# Patient Record
Sex: Female | Born: 1970 | Race: Black or African American | Hispanic: No | Marital: Married | Smoking: Never smoker
Health system: Southern US, Community
[De-identification: ages and names within clinical notes are randomized; demographics above are authoritative.]

## PROBLEM LIST (undated history)

## (undated) DIAGNOSIS — R011 Cardiac murmur, unspecified: Secondary | ICD-10-CM

## (undated) DIAGNOSIS — I10 Essential (primary) hypertension: Secondary | ICD-10-CM

## (undated) HISTORY — DX: Essential (primary) hypertension: I10

## (undated) HISTORY — DX: Cardiac murmur, unspecified: R01.1

---

## 1993-09-04 HISTORY — PX: ECTOPIC PREGNANCY SURGERY: SHX613

## 1995-09-05 HISTORY — PX: CHOLECYSTECTOMY: SHX55

## 2003-03-30 ENCOUNTER — Other Ambulatory Visit: Admission: RE | Admit: 2003-03-30 | Discharge: 2003-03-30 | Payer: Self-pay | Admitting: Gynecology

## 2006-09-04 HISTORY — PX: ABDOMINAL HYSTERECTOMY: SHX81

## 2007-11-23 ENCOUNTER — Emergency Department (HOSPITAL_COMMUNITY): Admission: EM | Admit: 2007-11-23 | Discharge: 2007-11-23 | Payer: Self-pay | Admitting: Emergency Medicine

## 2007-11-29 ENCOUNTER — Emergency Department (HOSPITAL_COMMUNITY): Admission: EM | Admit: 2007-11-29 | Discharge: 2007-11-29 | Payer: Self-pay | Admitting: Family Medicine

## 2008-01-04 ENCOUNTER — Emergency Department (HOSPITAL_COMMUNITY): Admission: EM | Admit: 2008-01-04 | Discharge: 2008-01-04 | Payer: Self-pay | Admitting: Family Medicine

## 2008-05-19 ENCOUNTER — Encounter: Payer: Self-pay | Admitting: Gynecology

## 2008-05-19 ENCOUNTER — Other Ambulatory Visit: Admission: RE | Admit: 2008-05-19 | Discharge: 2008-05-19 | Payer: Self-pay | Admitting: Gynecology

## 2008-05-19 ENCOUNTER — Ambulatory Visit: Payer: Self-pay | Admitting: Gynecology

## 2008-07-27 ENCOUNTER — Ambulatory Visit: Payer: Self-pay | Admitting: Gynecology

## 2009-03-25 ENCOUNTER — Ambulatory Visit: Payer: Self-pay | Admitting: Gynecology

## 2009-04-26 ENCOUNTER — Ambulatory Visit: Payer: Self-pay | Admitting: Gynecology

## 2010-04-18 ENCOUNTER — Emergency Department (HOSPITAL_COMMUNITY): Admission: EM | Admit: 2010-04-18 | Discharge: 2010-04-18 | Payer: Self-pay | Admitting: Emergency Medicine

## 2010-11-18 LAB — CBC
Hemoglobin: 14.5 g/dL (ref 12.0–15.0)
MCH: 27 pg (ref 26.0–34.0)
MCHC: 33.2 g/dL (ref 30.0–36.0)
Platelets: 268 10*3/uL (ref 150–400)
RBC: 5.38 MIL/uL — ABNORMAL HIGH (ref 3.87–5.11)
RDW: 13.8 % (ref 11.5–15.5)
WBC: 4.6 10*3/uL (ref 4.0–10.5)

## 2010-11-18 LAB — COMPREHENSIVE METABOLIC PANEL
ALT: 20 U/L (ref 0–35)
BUN: 19 mg/dL (ref 6–23)
CO2: 30 mEq/L (ref 19–32)
Creatinine, Ser: 0.88 mg/dL (ref 0.4–1.2)
GFR calc non Af Amer: >60 mL/min (ref >60)
Potassium: 3.9 mEq/L (ref 3.5–5.1)
Sodium: 138 mEq/L (ref 135–145)
Total Bilirubin: 0.7 mg/dL (ref 0.3–1.2)
Total Protein: 7.4 g/dL (ref 6.0–8.3)

## 2010-11-18 LAB — URINALYSIS, ROUTINE W REFLEX MICROSCOPIC
Bilirubin Urine: NEGATIVE
Ketones, ur: NEGATIVE mg/dL
Urobilinogen, UA: 0.2 mg/dL (ref 0.0–1.0)

## 2010-11-18 LAB — DIFFERENTIAL
Basophils Absolute: 0 10*3/uL (ref 0.0–0.1)
Basophils Relative: 1 % (ref 0–1)
Lymphocytes Relative: 34 % (ref 12–46)
Lymphs Abs: 1.5 10*3/uL (ref 0.7–4.0)
Monocytes Relative: 9 % (ref 3–12)
Neutrophils Relative %: 55 % (ref 43–77)

## 2010-11-18 LAB — POCT CARDIAC MARKERS

## 2011-02-16 ENCOUNTER — Ambulatory Visit (INDEPENDENT_AMBULATORY_CARE_PROVIDER_SITE_OTHER): Payer: PRIVATE HEALTH INSURANCE | Admitting: Family Medicine

## 2011-02-16 ENCOUNTER — Ambulatory Visit (HOSPITAL_BASED_OUTPATIENT_CLINIC_OR_DEPARTMENT_OTHER)
Admission: RE | Admit: 2011-02-16 | Discharge: 2011-02-16 | Disposition: A | Discharge status: Home / Self Care | Payer: PRIVATE HEALTH INSURANCE | Source: Ambulatory Visit | Attending: Family Medicine | Admitting: Family Medicine

## 2011-02-16 ENCOUNTER — Encounter: Payer: Self-pay | Admitting: Family Medicine

## 2011-02-16 DIAGNOSIS — R635 Abnormal weight gain: Secondary | ICD-10-CM

## 2011-02-16 DIAGNOSIS — I10 Essential (primary) hypertension: Secondary | ICD-10-CM

## 2011-02-16 DIAGNOSIS — E049 Nontoxic goiter, unspecified: Secondary | ICD-10-CM

## 2011-02-16 DIAGNOSIS — R220 Localized swelling, mass and lump, head: Secondary | ICD-10-CM | POA: Insufficient documentation

## 2011-02-16 DIAGNOSIS — R059 Cough, unspecified: Secondary | ICD-10-CM | POA: Insufficient documentation

## 2011-02-16 DIAGNOSIS — Z0000 Encounter for general adult medical examination without abnormal findings: Secondary | ICD-10-CM

## 2011-02-16 DIAGNOSIS — R05 Cough: Secondary | ICD-10-CM | POA: Insufficient documentation

## 2011-02-16 LAB — LDL CHOLESTEROL, DIRECT: Direct LDL: 102 mg/dL

## 2011-02-16 LAB — BASIC METABOLIC PANEL
CO2: 29 mEq/L (ref 19–32)
Calcium: 9.9 mg/dL (ref 8.4–10.5)
Chloride: 104 mEq/L (ref 96–112)

## 2011-02-16 LAB — CBC WITH DIFFERENTIAL/PLATELET
Basophils Relative: 1 % (ref 0.0–3.0)
Eosinophils Absolute: 0.1 10*3/uL (ref 0.0–0.7)
Hemoglobin: 14.1 g/dL (ref 12.0–15.0)
Lymphocytes Relative: 29.4 % (ref 12.0–46.0)
MCHC: 33.1 g/dL (ref 30.0–36.0)
MCV: 82.4 fl (ref 78.0–100.0)
Neutrophils Relative %: 61.5 % (ref 43.0–77.0)
Platelets: 301 10*3/uL (ref 150.0–400.0)
RBC: 5.17 Mil/uL — ABNORMAL HIGH (ref 3.87–5.11)
RDW: 13.9 % (ref 11.5–14.6)
WBC: 6.2 10*3/uL (ref 4.5–10.5)

## 2011-02-16 LAB — HEPATIC FUNCTION PANEL
ALT: 17 U/L (ref 0–35)
Albumin: 4.5 g/dL (ref 3.5–5.2)
Alkaline Phosphatase: 54 U/L (ref 39–117)

## 2011-02-16 LAB — T4, FREE: Free T4: 0.77 ng/dL (ref 0.60–1.60)

## 2011-02-16 LAB — TSH: TSH: 0.6 u[IU]/mL (ref 0.35–5.50)

## 2011-02-16 MED ORDER — TRIAMTERENE-HCTZ 37.5-25 MG PO TABS: 1.0000 | ORAL_TABLET | Freq: Every day | ORAL | Status: DC

## 2011-02-16 NOTE — Patient Instructions (Signed)
IGoiter Goiter is an enlarged thyroid gland. The thyroid gland sits at the base of the front of the neck. The gland produces important hormones for the body that regulate mood, body temperature, pulse rate, digestion, and many other functions. Most goiters are painless and are not a cause for serious concern. Goiters and conditions that cause goiters can be treated if necessary.  CAUSES  Common causes of goiter include:  Graves disease (causes too much hormone to be produced [hyperthyroidism]).   Hashimoto's disease (causes too little hormone to be produced [hypothyroidism]).   Thyroiditis (inflammation of the gland sometimes due to virus or pregnancy).   Nodular goiter (small bumps form; sometimes called toxic nodular goiter).   Pregnancy.   Thyroid cancer (very small percentage of goiters with nodules are cancerous).   Certain medications.   Radiation exposure.   Iodine deficiency (more common in developing countries in inland populations).  Risk factors for goiter include:  Having a family history of goiter.   Being female.   Inadequate iodine in the diet.   Being older than 40 years.  SYMPTOMS Many goiters do not cause symptoms. When they do occur, symptoms may include:  Swelling in the lower part of the neck. This swelling can range from a very small bump to a large lump.   A tight feeling in the throat.   A hoarse voice.  Less commonly, a goiter may result in:  Coughing.   Wheezing.   Difficulty swallowing.   Difficulty breathing.   Bulging neck veins.   Dizziness.  When a goiter is the result of an overactive thyroid (hyperthyroidism), symptoms may include:  Rapid or irregular heart beat.   Feeling sick to your stomach (nauseous).   Vomiting.   Diarrhea.   Shaking.   Irritable feeling.   Bulging eyes.   Weight loss.   Heat sensitivity.   Anxiety.  When a goiter is the result of an underactive thyroid (hypothyroidism), symptoms may  include:  Tiredness.   Dry skin.   Constipation.   Weight gain.   Irregular menstrual cycle.   Depressed mood.   Sensitivity to cold.  DIAGNOSIS Tests used to diagnose goiter include:  A physical exam.   Blood tests, including thyroid hormone levels and antibody testing.   Ultrasonography, computerized X-ray scan (computed tomography, CT) or computerized magnetic scan (magnetic resonance imaging, MRI).   Thyroid scan (imaging along with safe radioactive injection).   Tissue sample taken (biopsy) of nodules. This is sometimes done to confirm that the nodules are not cancerous.  TREATMENT Treatment will depend on the cause of the goiter. Treatment may include:  Monitoring. In some cases, no treatment is necessary, and your doctor will monitor you at regular check ups.   Medications and supplements. Thyroid medication (thyroid hormone replacement) is available for overactive and underactive thyroids.   If inflammation is the cause, over-the-counter medication or steroid medication may be recommended.   Goiters caused by iodine deficiency can be treated with iodine supplements or changes in diet.   Radioactive iodine treatment. Radioactive iodine is injected into the blood. It travels to the thyroid gland, kills thyroid cells, and reduces the size of the gland. This is only used when the thyroid gland is overactive. Lifelong thyroid hormone medication is often necessary after this treatment.   Surgery. A procedure to remove all or part of the gland may be recommended in severe cases or when cancer is the cause. Hormones can be taken to replace the hormones normally produced  by the thyroid.  HOME CARE INSTRUCTIONS   Take medications as directed.   Follow your caregiver's recommendations for any dietary changes.   Follow up with your caregiver for further examination and testing, as directed.  PROGNOSIS Once the cause of the goiter is known, treatment to relieve symptoms or  manage underlying conditions is usually very successful. PREVENTION   If you have a family history of goiter, discuss screening with your doctor.   Make sure you are getting enough iodine in your diet.   Use of iodized table salt can help prevent iodine deficiency.  SEEK MEDICAL CARE IF YOU EXPERIENCE ANY SYMPTOMS OF GOITER SEEK IMMEDIATE MEDICAL CARE IF:  You have difficulty swallowing.   You have difficulty breathing.   You have bulging neck veins.   You experience dizziness.   You develop a rapid or irregular heart beat.  MAKE SURE YOU:   Understand these instructions.   Watch your condition.   Will get help right away if you are not doing well or get worse.  Not all test results may be available during your visit. If your test results are not back during the visit, make an appointment with your caregiver to find out the results. Do not assume everything is normal if you have not heard from your caregiver or the medical facility. It is important for you to follow up on all of your test results.  Document Released: 02/08/2010  [facility] Patient Information 2011 [place], [place].

## 2011-02-16 NOTE — Progress Notes (Signed)
  Subjective:    Patient ID: Alicia Solomon, female    DOB: [birth year], 40 y.o.   MRN: [id number]  Thyroid Problem   Pt here to establish and thinks she may have some thyroid issues.  Her labs were abn in March when they were done at [facility]. Pt is having trouble  Losing weight and has gained 40 lbs in last year.  She also c/o brittle hairs and nails,  Dry skin and is usually constipated.  No other complaints.    Review of Systems  All other systems reviewed and are negative.       Objective:   Physical Exam  Constitutional: She is oriented to person, place, and time. She appears well-developed and well-nourished. No distress.  Neck: Normal range of motion. Neck supple. No JVD present. No tracheal deviation present. Thyromegaly present.  Cardiovascular: Normal rate, regular rhythm and normal heart sounds.   No murmur heard. Pulmonary/Chest: Effort normal and breath sounds normal. No respiratory distress. She has no wheezes. She has no rales. She exhibits no tenderness.  Musculoskeletal: Normal range of motion. She exhibits no edema and no tenderness.  Lymphadenopathy:    She has no cervical adenopathy.  Neurological: She is alert and oriented to person, place, and time.  Psychiatric: She has a normal mood and affect. Her behavior is normal. Judgment and thought content normal.          Assessment & Plan:

## 2011-02-27 ENCOUNTER — Encounter: Payer: Self-pay | Admitting: *Deleted

## 2011-05-19 ENCOUNTER — Encounter (INDEPENDENT_AMBULATORY_CARE_PROVIDER_SITE_OTHER): Payer: PRIVATE HEALTH INSURANCE | Admitting: Family Medicine

## 2011-05-19 NOTE — Progress Notes (Signed)
This encounter was created in error - please disregard.

## 2011-05-29 LAB — POCT URINALYSIS DIP (DEVICE)
Bilirubin Urine: NEGATIVE
Glucose, UA: NEGATIVE
Ketones, ur: NEGATIVE
Nitrite: POSITIVE — AB
Protein, ur: 100 — AB
pH: 8.5 — ABNORMAL HIGH

## 2011-05-29 LAB — BASIC METABOLIC PANEL
BUN: 7
Calcium: 9.2
Chloride: 105
GFR calc Af Amer: >60

## 2011-11-06 ENCOUNTER — Telehealth: Payer: Self-pay

## 2011-11-06 NOTE — Telephone Encounter (Signed)
.  [facility] PT WOULD LIKE TO SPEAK WITH SOMEONE ABOUT SOME MEDICINE. DIDN'T WANT TO SAY MORE TO ME PLEASE CALL [phone]

## 2011-11-07 NOTE — Telephone Encounter (Signed)
Pt reports that she has chronic problems with recurrent BV and Dr L Rxd Tindamax 500 mg, and boric acid the last time and it really helped her. She stopped using the boric acid Q month (like she should have) and the BV came back. Pt requests RF of Tindamax if possible. She still has RFs of boric acid but her insurance doesn't start until April and she really doesn't have the money to RTC right now. Can we RF Tindamax to [facility]? Dr L, I put pts chart in your box

## 2011-11-08 ENCOUNTER — Other Ambulatory Visit: Payer: Self-pay | Admitting: Family Medicine

## 2011-11-08 MED ORDER — TINIDAZOLE 500 MG PO TABS: 2.0000 g | ORAL_TABLET | Freq: Every day | ORAL | Status: AC

## 2011-11-09 NOTE — Progress Notes (Signed)
Notified pt that Dr L has sent Rx to [facility] pharmacy for her.

## 2012-03-11 ENCOUNTER — Other Ambulatory Visit: Payer: Self-pay | Admitting: Family Medicine

## 2012-03-11 DIAGNOSIS — I10 Essential (primary) hypertension: Secondary | ICD-10-CM

## 2012-03-11 MED ORDER — TRIAMTERENE-HCTZ 37.5-25 MG PO TABS: 1.0000 | ORAL_TABLET | Freq: Every day | ORAL | Status: DC

## 2012-03-11 NOTE — Telephone Encounter (Signed)
refill triamterene-hctz 37.5-25 #30, take one tablet by mouth daily, last fill 5.15.13, last ov 6.14.12

## 2012-06-19 DIAGNOSIS — Z8249 Family history of ischemic heart disease and other diseases of the circulatory system: Secondary | ICD-10-CM | POA: Insufficient documentation

## 2012-06-19 DIAGNOSIS — R0789 Other chest pain: Secondary | ICD-10-CM | POA: Insufficient documentation

## 2012-06-19 DIAGNOSIS — R42 Dizziness and giddiness: Secondary | ICD-10-CM | POA: Insufficient documentation

## 2012-06-19 DIAGNOSIS — R0602 Shortness of breath: Secondary | ICD-10-CM | POA: Insufficient documentation

## 2012-06-19 DIAGNOSIS — R002 Palpitations: Secondary | ICD-10-CM | POA: Insufficient documentation

## 2013-02-25 ENCOUNTER — Ambulatory Visit (INDEPENDENT_AMBULATORY_CARE_PROVIDER_SITE_OTHER): Payer: Managed Care, Other (non HMO) | Admitting: Internal Medicine

## 2013-02-25 VITALS — BP 100/68 | HR 72 | Temp 98.2°F | Resp 16 | Ht 64.0 in | Wt 198.0 lb

## 2013-02-25 DIAGNOSIS — I10 Essential (primary) hypertension: Secondary | ICD-10-CM | POA: Insufficient documentation

## 2013-02-25 DIAGNOSIS — Z6833 Body mass index (BMI) 33.0-33.9, adult: Secondary | ICD-10-CM | POA: Insufficient documentation

## 2013-02-25 DIAGNOSIS — R300 Dysuria: Secondary | ICD-10-CM

## 2013-02-25 DIAGNOSIS — N390 Urinary tract infection, site not specified: Secondary | ICD-10-CM

## 2013-02-25 LAB — POCT UA - MICROSCOPIC ONLY
Casts, Ur, LPF, POC: NEGATIVE
Crystals, Ur, HPF, POC: NEGATIVE

## 2013-02-25 LAB — POCT URINALYSIS DIPSTICK
Glucose, UA: NEGATIVE
Spec Grav, UA: >=1.03
Urobilinogen, UA: 1
pH, UA: 5.5

## 2013-02-25 MED ORDER — CIPROFLOXACIN HCL 250 MG PO TABS: 250.0000 mg | ORAL_TABLET | Freq: Two times a day (BID) | ORAL | Status: DC

## 2013-02-25 NOTE — Progress Notes (Signed)
  Subjective:    Patient ID: Alicia Solomon, female    DOB: 1971-01-22, 42 y.o.   MRN: [id number]  HPI suprapubic pressure with urgency and frequency over the last 36-48 hours No fever nausea vomiting abdominal or flank pain  Mild dysuria No vaginal discharge Status post hysterectomy Treated for chlamydia 6 months ago after husband's infidelity they are now back together and trying to work it out  Hypertension on Maxzide  Review of Systems Noncontributory    Objective:   Physical Exam Vital signs stable No CVA tenderness to percussion Abdomen supple       Results for orders placed in visit on 02/25/13  POCT URINALYSIS DIPSTICK      Result Value Range   Color, UA yellowish brown     Clarity, UA cloudy     Glucose, UA neg     Bilirubin, UA small     Ketones, UA trace     Spec Grav, UA >=1.030     Blood, UA large     pH, UA 5.5     Protein, UA 100     Urobilinogen, UA 1.0     Nitrite, UA positive     Leukocytes, UA moderate (2+)    POCT UA - MICROSCOPIC ONLY      Result Value Range   WBC, Ur, HPF, POC 25-35     RBC, urine, microscopic TNTC     Bacteria, U Microscopic 2+3     Mucus, UA small     Epithelial cells, urine per micros 2-4     Crystals, Ur, HPF, POC neg     Casts, Ur, LPF, POC neg     Yeast, UA neg      Assessment & Plan:  Urinary tract infection  Cipro 250 twice a day for 5 days Urine culture

## 2013-02-28 LAB — URINE CULTURE: Colony Count: >=100000

## 2013-03-01 ENCOUNTER — Telehealth: Payer: Self-pay

## 2013-03-01 NOTE — Telephone Encounter (Signed)
Patient was seen for a UTI and the ciprofloxicin is not making her feel any better. Patient is also spotting. Patient uses Therapist, occupational on [address]. Best number: [phone]

## 2013-03-03 ENCOUNTER — Ambulatory Visit (INDEPENDENT_AMBULATORY_CARE_PROVIDER_SITE_OTHER): Payer: Managed Care, Other (non HMO) | Admitting: Family Medicine

## 2013-03-03 VITALS — BP 112/78 | HR 69 | Temp 98.6°F | Resp 18 | Ht 64.0 in | Wt 196.2 lb

## 2013-03-03 DIAGNOSIS — Z113 Encounter for screening for infections with a predominantly sexual mode of transmission: Secondary | ICD-10-CM

## 2013-03-03 DIAGNOSIS — N760 Acute vaginitis: Secondary | ICD-10-CM

## 2013-03-03 DIAGNOSIS — R319 Hematuria, unspecified: Secondary | ICD-10-CM

## 2013-03-03 DIAGNOSIS — B9689 Other specified bacterial agents as the cause of diseases classified elsewhere: Secondary | ICD-10-CM

## 2013-03-03 LAB — POCT UA - MICROSCOPIC ONLY
Bacteria, U Microscopic: NEGATIVE
Crystals, Ur, HPF, POC: NEGATIVE
Mucus, UA: NEGATIVE
RBC, urine, microscopic: NEGATIVE
WBC, Ur, HPF, POC: NEGATIVE
Yeast, UA: NEGATIVE

## 2013-03-03 LAB — POCT WET PREP WITH KOH
KOH Prep POC: NEGATIVE
RBC Wet Prep HPF POC: NEGATIVE
Trichomonas, UA: NEGATIVE
Yeast Wet Prep HPF POC: NEGATIVE

## 2013-03-03 LAB — POCT URINALYSIS DIPSTICK
Blood, UA: NEGATIVE
Glucose, UA: NEGATIVE
Urobilinogen, UA: 0.2
pH, UA: 5.5

## 2013-03-03 MED ORDER — METRONIDAZOLE 500 MG PO TABS: 500.0000 mg | ORAL_TABLET | Freq: Two times a day (BID) | ORAL | Status: DC

## 2013-03-03 MED ORDER — FLUCONAZOLE 150 MG PO TABS: 150.0000 mg | ORAL_TABLET | Freq: Once | ORAL | Status: DC

## 2013-03-03 NOTE — Telephone Encounter (Signed)
Called her to advise.  

## 2013-03-03 NOTE — Telephone Encounter (Signed)
?   Return or try another antibiotic

## 2013-03-03 NOTE — Telephone Encounter (Signed)
Due to new onset of spotting (especially since she is s/p hysterectomy) she will need to return for further evaluation and recheck.

## 2013-03-03 NOTE — Progress Notes (Signed)
[facility]:  Office Visit  Chief Complaint:  Chief Complaint  Patient presents with  . Follow-up    seen for UTI  still feels irritated and another episode of blood    HPI: Alicia Solomon is a 42 y.o. female who complains of :  Still feels irritated and spotting some more. No vaginal dc or pelvic pain. Lest spotting of blood in urine was 2 days ago. She has had no odor, no fevers, chills. Please look at last OV HPI   HPI suprapubic pressure with urgency and frequency over the last 36-48 hours  No fever nausea vomiting abdominal or flank pain  Mild dysuria  No vaginal discharge  Status post hysterectomy  Treated for chlamydia 6 months ago after husband's infidelity they are now back together and trying to work it out   Past Medical History  Diagnosis Date  . Heart murmur   . Hypertension    Past Surgical History  Procedure Laterality Date  . Cesarean section  1988  . Ectopic pregnancy surgery  1995  . Abdominal hysterectomy  2008  . Cholecystectomy  1997   History   Social History  . Marital Status: Married    Spouse Name: N/A    Number of Children: N/A  . Years of Education: N/A   Social History Main Topics  . Smoking status: Never Smoker   . Smokeless tobacco: Never Used  . Alcohol Use: Yes     Comment: occasional  . Drug Use: No  . Sexually Active: None   Other Topics Concern  . None   Social History Narrative  . None   Family History  Problem Relation Age of Onset  . Hypertension Mother   . Colon cancer Maternal Aunt   . Breast cancer Maternal Aunt   . Prostate cancer Maternal Uncle   . Hyperlipidemia Maternal Aunt    No Known Allergies Prior to Admission medications   Medication Sig Start Date End Date Taking? Authorizing Provider  ciprofloxacin (CIPRO) 250 MG tablet Take 1 tablet (250 mg total) by mouth 2 (two) times daily. 02/25/13  Yes Tonye Pearson, MD  triamterene-hydrochlorothiazide (MAXZIDE-25) 37.5-25 MG per  tablet Take 1 each (1 tablet total) by mouth daily. 03/11/12  Yes Yvonne R Lowne, DO     ROS: The patient denies fevers, chills, night sweats, unintentional weight loss, chest pain, palpitations, wheezing, dyspnea on exertion, nausea, vomiting, abdominal pain, dysuria, melena, numbness, weakness, or tingling.  All other systems have been reviewed and were otherwise negative with the exception of those mentioned in the HPI and as above.    PHYSICAL EXAM: Filed Vitals:   03/03/13 1834  BP: 112/78  Pulse: 69  Temp: 98.6 F (37 C)  Resp: 18   Filed Vitals:   03/03/13 1834  Height: 5\' 4"  (1.626 m)  Weight: 196 lb 3.2 oz (88.996 kg)   Body mass index is 33.66 kg/(m^2).  General: Alert, no acute distress HEENT:  Normocephalic, atraumatic, oropharynx patent.  Cardiovascular:  Regular rate and rhythm, no rubs murmurs or gallops.  No Carotid bruits, radial pulse intact. No pedal edema.  Respiratory: Clear to auscultation bilaterally.  No wheezes, rales, or rhonchi.  No cyanosis, no use of accessory musculature GI: No organomegaly, abdomen is soft and non-tender, positive bowel sounds.  No masses. Skin: No rashes. Neurologic: Facial musculature symmetric. Psychiatric: Patient is appropriate throughout our interaction. Lymphatic: No cervical lymphadenopathy Musculoskeletal: Gait intact. No cervix. + yellow-white vaginal dc, mild  odor   LABS: Results for orders placed in visit on 03/03/13  POCT URINALYSIS DIPSTICK      Result Value Range   Color, UA lt yellow     Clarity, UA clear     Glucose, UA neg     Bilirubin, UA neg     Ketones, UA neg     Spec Grav, UA 1.010     Blood, UA neg     pH, UA 5.5     Protein, UA neg     Urobilinogen, UA 0.2     Nitrite, UA neg     Leukocytes, UA Negative    POCT UA - MICROSCOPIC ONLY      Result Value Range   WBC, Ur, HPF, POC neg     RBC, urine, microscopic neg     Bacteria, U Microscopic neg     Mucus, UA neg     Epithelial cells, urine  per micros neg     Crystals, Ur, HPF, POC neg     Casts, Ur, LPF, POC neg     Yeast, UA neg    POCT WET PREP WITH KOH      Result Value Range   Trichomonas, UA Negative     Clue Cells Wet Prep HPF POC 3-8     Epithelial Wet Prep HPF POC 5-21     Yeast Wet Prep HPF POC neg     Bacteria Wet Prep HPF POC 2+     RBC Wet Prep HPF POC neg     WBC Wet Prep HPF POC 0-1     KOH Prep POC Negative       EKG/XRAY:   Primary read interpreted by Dr. Conley Rolls at [facility].   ASSESSMENT/PLAN: Encounter Diagnoses  Name Primary?  . Hematuria Yes  . Bacterial vaginosis   . Screening for STD (sexually transmitted disease)    Rx Flagyl, Diflucan G/C pending F/u prn    Chauntay Paszkiewicz PHUONG, DO 03/03/2013 8:19 PM

## 2013-03-05 LAB — GC/CHLAMYDIA PROBE AMP
CT Probe RNA: NEGATIVE
GC Probe RNA: NEGATIVE

## 2014-12-08 ENCOUNTER — Encounter: Payer: Self-pay | Admitting: *Deleted

## 2015-08-18 ENCOUNTER — Ambulatory Visit: Payer: Self-pay | Admitting: Family

## 2015-08-18 ENCOUNTER — Encounter: Payer: Self-pay | Admitting: Physician Assistant

## 2015-08-18 VITALS — BP 100/70 | HR 60 | Temp 98.0°F

## 2015-08-18 DIAGNOSIS — I10 Essential (primary) hypertension: Secondary | ICD-10-CM

## 2015-08-18 MED ORDER — TRIAMTERENE-HCTZ 37.5-25 MG PO TABS: 1.0000 | ORAL_TABLET | Freq: Every day | ORAL | Status: DC

## 2015-08-18 NOTE — Progress Notes (Signed)
S/ long hx of HTN stable on maxide is in the process of obtaining full benefits/ and establishing care with new PCP  , has  Been taking one half tablet for several days and needs refill Denies CP , SOB, edema . Eye exam and labs  a little over a yr ago . NEVER HAD A MAMMOGRAM   O/ VSS alert pleasant NAD Neck supple, Heart rsr lungs clear ext without edema  A/ HTN  P refill maxzide  One daily for bp #30 2 refills . Continue lifestyle changes. Strongly encouraged mammogram .

## 2016-07-14 ENCOUNTER — Ambulatory Visit: Payer: Self-pay | Admitting: Physician Assistant

## 2016-07-14 ENCOUNTER — Encounter: Payer: Self-pay | Admitting: Physician Assistant

## 2016-07-14 VITALS — BP 110/80 | HR 72 | Temp 97.8°F

## 2016-07-14 DIAGNOSIS — R300 Dysuria: Secondary | ICD-10-CM | POA: Insufficient documentation

## 2016-07-14 LAB — POCT URINALYSIS DIPSTICK
Bilirubin, UA: NEGATIVE
Glucose, UA: NEGATIVE
Ketones, UA: NEGATIVE
Leukocytes, UA: NEGATIVE
NITRITE UA: NEGATIVE
PH UA: 5.5
Protein, UA: NEGATIVE
Spec Grav, UA: 1.01
UROBILINOGEN UA: 0.2

## 2016-07-14 MED ORDER — PHENAZOPYRIDINE HCL 200 MG PO TABS: 200.0000 mg | ORAL_TABLET | Freq: Three times a day (TID) | ORAL | 0 refills | Status: DC | PRN

## 2016-07-14 NOTE — Progress Notes (Signed)
   Subjective:Dysuria    Patient ID: Alicia Solomon, female    DOB: 1971-06-30, 45 y.o.   MRN: [id number]  HPI Patient c/o of slight burning sensation while voiding for 2 days. States almost resolved after using Cranberry juice. Denies vaginal discharge, flank pain, or fever.   Review of Systems Negative except for compliant.    Objective:   Physical Exam No acute distress. Dip UA was unremarkable.       Assessment & Plan:Dysuria  Advised continual conservative care and take Pyridium as needed.

## 2016-12-07 ENCOUNTER — Ambulatory Visit: Payer: Self-pay | Admitting: Physician Assistant

## 2016-12-07 ENCOUNTER — Encounter: Payer: Self-pay | Admitting: Physician Assistant

## 2016-12-07 DIAGNOSIS — I10 Essential (primary) hypertension: Secondary | ICD-10-CM

## 2016-12-07 MED ORDER — TRIAMTERENE-HCTZ 37.5-25 MG PO TABS: 1.0000 | ORAL_TABLET | Freq: Every day | ORAL | 3 refills | Status: DC

## 2016-12-07 NOTE — Progress Notes (Signed)
S: here for med refill, states she ran out of medication today and hasn't set up her physical yet.  Last physical was in March 2017. No problems with the medication, seems to work well, no other complaints  O: vitals wnl, nad, lungs c t a, cv rrr  A: htn  P: maxide refill x 3, f/u with pcp

## 2017-10-05 DIAGNOSIS — B9689 Other specified bacterial agents as the cause of diseases classified elsewhere: Secondary | ICD-10-CM | POA: Insufficient documentation

## 2017-10-05 DIAGNOSIS — N760 Acute vaginitis: Secondary | ICD-10-CM | POA: Insufficient documentation

## 2017-11-21 MED FILL — FLUTICASONE PROP 50 MCG SPR: 50 | 60 days supply | Qty: 32 | Fill #0

## 2017-11-21 MED FILL — PROAIR HFA 90 MCG INHALER: 108 (90 BAS | 50 days supply | Qty: 9 | Fill #0

## 2018-04-08 ENCOUNTER — Encounter (HOSPITAL_BASED_OUTPATIENT_CLINIC_OR_DEPARTMENT_OTHER): Payer: Self-pay

## 2018-04-08 ENCOUNTER — Other Ambulatory Visit: Payer: Self-pay

## 2018-04-08 ENCOUNTER — Emergency Department (HOSPITAL_BASED_OUTPATIENT_CLINIC_OR_DEPARTMENT_OTHER)
Admission: EM | Admit: 2018-04-08 | Discharge: 2018-04-08 | Disposition: A | Discharge status: Home / Self Care | Payer: No Typology Code available for payment source | Attending: Emergency Medicine | Admitting: Emergency Medicine

## 2018-04-08 DIAGNOSIS — I10 Essential (primary) hypertension: Secondary | ICD-10-CM | POA: Diagnosis not present

## 2018-04-08 DIAGNOSIS — M25512 Pain in left shoulder: Secondary | ICD-10-CM | POA: Diagnosis not present

## 2018-04-08 DIAGNOSIS — M542 Cervicalgia: Secondary | ICD-10-CM

## 2018-04-08 DIAGNOSIS — Z79899 Other long term (current) drug therapy: Secondary | ICD-10-CM | POA: Insufficient documentation

## 2018-04-08 MED ORDER — LIDOCAINE 5 % EX PTCH: 1.0000 | MEDICATED_PATCH | CUTANEOUS | 0 refills | Status: DC

## 2018-04-08 MED ORDER — METHOCARBAMOL 500 MG PO TABS: 500.0000 mg | ORAL_TABLET | Freq: Two times a day (BID) | ORAL | 0 refills | Status: DC

## 2018-04-08 NOTE — ED Triage Notes (Signed)
C/o pain to posterior nec, left upper back x 2 weeks-denies injury-NAD-steady gait

## 2018-04-08 NOTE — Discharge Instructions (Signed)
Please follow-up with your primary care provider regarding your visit today. Please return to the emergency department for any new or worsening symptoms. You may continue to use over-the-counter anti-inflammatory medication such as Tylenol/ibuprofen as directed on the packaging. You may use the muscle relaxer, Robaxin, as prescribed.  Please do not drive or operate heavy machinery while taking this medication because it will make you drowsy. You may apply the Lidoderm patch to your left shoulder/neck as prescribed. You may also use heating pads intermittently to the area.  Contact a health care provider if: Your muscle pain gets worse and medicines do not help. You have muscle pain that lasts longer than 3 days. You have a rash or fever along with muscle pain. You have muscle pain after a tick bite. You have muscle pain while working out, even though you are in good physical condition. You have redness, soreness, or swelling along with muscle pain. You have muscle pain after starting a new medicine or changing the dose of a medicine. Get help right away if: You have trouble breathing. You have trouble swallowing. You have numbness/tingling to your arms or legs. You have muscle pain along with a stiff neck, fever, and vomiting. You have severe muscle weakness or cannot move part of your body.

## 2018-04-08 NOTE — ED Provider Notes (Signed)
[facility] EMERGENCY DEPARTMENT Provider Note   CSN: [id number] Arrival date & time: 04/08/18  1857     History   Chief Complaint Chief Complaint  Patient presents with  . Neck Pain    HPI Alicia Solomon is a 47 y.o. female presenting for 2 weeks of left sided neck and left trapezius pain.  Patient states the pain is 6/10 in severity, throbbing in nature and occasionally will radiate to her left shoulder.  Patient states that she has been taking ibuprofen for her pain without relief.  Patient states that pain is worse with turning her head to the left and shrugging her left shoulder.  Patient denies weakness/numbing/tingling to her extremities.  Patient denies injury/fall or history of trauma.  HPI  Past Medical History:  Diagnosis Date  . Heart murmur   . Hypertension     Patient Active Problem List   Diagnosis Date Noted  . Dysuria 07/14/2016  . Unspecified essential hypertension 02/25/2013  . BMI 33.0-33.9,adult 02/25/2013    Past Surgical History:  Procedure Laterality Date  . ABDOMINAL HYSTERECTOMY  2008  . CESAREAN SECTION  1988  . CHOLECYSTECTOMY  1997  . ECTOPIC PREGNANCY SURGERY  1995     OB History   None      Home Medications    Prior to Admission medications   Medication Sig Start Date End Date Taking? Authorizing Provider  ciprofloxacin (CIPRO) 250 MG tablet Take 1 tablet (250 mg total) by mouth 2 (two) times daily. Patient not taking: Reported on 07/14/2016 02/25/13   Tonye Pearsonoolittle, Robert P, MD  fluconazole (DIFLUCAN) 150 MG tablet Take 1 tablet (150 mg total) by mouth once. May repeat as needed Patient not taking: Reported on 08/18/2015 03/03/13   Le, Thao P, DO  lidocaine (LIDODERM) 5 % Place 1 patch onto the skin daily. Remove & Discard patch within 12 hours or as directed by MD 04/08/18   Bill SalinasMorelli, Ruchama Kubicek A, PA-C  methocarbamol (ROBAXIN) 500 MG tablet Take 1 tablet (500 mg total) by mouth 2 (two) times daily. 04/08/18   Harlene SaltsMorelli, Biridiana Twardowski A,  PA-C  metroNIDAZOLE (FLAGYL) 500 MG tablet Take 1 tablet (500 mg total) by mouth 2 (two) times daily. Patient not taking: Reported on 08/18/2015 03/03/13   Le, Thao P, DO  phenazopyridine (PYRIDIUM) 200 MG tablet Take 1 tablet (200 mg total) by mouth 3 (three) times daily as needed for pain. Patient not taking: Reported on 12/07/2016 07/14/16   Joni ReiningSmith, Ronald K, PA-C  triamterene-hydrochlorothiazide (MAXZIDE-25) 37.5-25 MG tablet Take 1 tablet by mouth daily. 12/07/16   Sherrie MustacheFisher, Roselyn BeringSusan W, PA-C    Family History Family History  Problem Relation Age of Onset  . Hypertension Mother   . Colon cancer Maternal Aunt   . Breast cancer Maternal Aunt   . Prostate cancer Maternal Uncle   . Hyperlipidemia Maternal Aunt     Social History Social History   Tobacco Use  . Smoking status: Never Smoker  . Smokeless tobacco: Never Used  Substance Use Topics  . Alcohol use: Yes    Comment: occ  . Drug use: No     Allergies   Patient has no known allergies.   Review of Systems Review of Systems  Constitutional: Negative.  Negative for chills, fatigue and fever.  Eyes: Negative.  Negative for visual disturbance.  Respiratory: Negative.  Negative for shortness of breath.   Cardiovascular: Negative.  Negative for chest pain.  Musculoskeletal: Positive for back pain and neck pain.  Skin: Negative.  Negative for wound.  Neurological: Negative.  Negative for dizziness, syncope, weakness, numbness and headaches.     Physical Exam Updated Vital Signs BP 111/84 (BP Location: Left Arm)   Pulse 60   Temp 98.6 F (37 C) (Oral)   Resp 18   Ht 5\' 4"  (1.626 m)   Wt 87.1 kg (192 lb)   SpO2 99%   BMI 32.96 kg/m   Physical Exam  Constitutional: She is oriented to person, place, and time. She appears well-developed and well-nourished. No distress.  HENT:  Head: Normocephalic and atraumatic.  Right Ear: External ear normal.  Left Ear: External ear normal.  Nose: Nose normal.  Eyes: Pupils are  equal, round, and reactive to light. EOM are normal.  Neck: Trachea normal and normal range of motion. No tracheal deviation present.  Pulmonary/Chest: Effort normal. No respiratory distress.  Abdominal: Soft. There is no tenderness. There is no rebound and no guarding.  Musculoskeletal: Normal range of motion. She exhibits no deformity.       Back:  No midline spinal tenderness to palpation, no deformity, crepitus, or step-off noted.  Patient with full range of motion of the head/neck.  Patient with left-sided cervical muscle tenderness to palpation as well as left trapezius muscle tenderness to palpation.  Neurological: She is alert and oriented to person, place, and time. She has normal strength. No cranial nerve deficit or sensory deficit.  Mental Status: Alert, oriented, thought content appropriate, able to give a coherent history. Speech fluent without evidence of aphasia. Able to follow 2 step commands without difficulty. Cranial Nerves: II: Peripheral visual fields grossly normal, pupils equal, round, reactive to light III,IV, VI: ptosis not present, extra-ocular motions intact bilaterally V,VII: smile symmetric, eyebrows raise symmetric, facial light touch sensation equal VIII: hearing grossly normal to voice X: uvula elevates symmetrically XI: bilateral shoulder shrug symmetric and strong XII: midline tongue extension without fassiculations Motor: Normal tone. 5/5 strength in upper and lower extremities bilaterally including strong and equal grip strength, push/pull, and dorsiflexion/plantar flexion Sensory: Sensation intact to light touch in all extremities. Gait: normal gait and balance CV: distal pulses palpable throughout  Skin: Skin is warm and dry. Capillary refill takes less than 2 seconds.  Psychiatric: She has a normal mood and affect. Her behavior is normal.     ED Treatments / Results  Labs (all labs ordered are listed, but only abnormal results are  displayed) Labs Reviewed - No data to display  EKG None  Radiology No results found.  Procedures Procedures (including critical care time)  Medications Ordered in ED Medications - No data to display   Initial Impression / Assessment and Plan / ED Course  I have reviewed the triage vital signs and the nursing notes.  Pertinent labs & imaging results that were available during my care of the patient were reviewed by me and considered in my medical decision making (see chart for details).    Alicia Solomon is a 47 y.o. female who presents to ED for left-sided neck pain. On exam, patient is with negative NEXUS (no midline spinal tenderness, no focal neuro deficits, no evidence of intoxication is present, normal level of alertness, no painful distracting injury).  Signs and symptoms most likely due to muscle spasms of left trapezius. Will give rx for muscle relaxer and lidoderm, patient informed not to drive with taking Robaxin.Marland Kitchen Symptomatic home care instructions and return precautions discussed. Strongly encouraged to follow up with primary-care provider. All questions  answered.  At this time there does not appear to be any evidence of an acute emergency medical condition and the patient appears stable for discharge with appropriate outpatient follow up. Diagnosis was discussed with patient who verbalizes understanding of care plan and is agreeable to discharge. I have discussed return precautions with patient who verbalizes understanding of return precautions. Patient strongly encouraged to follow-up with their PCP. All questions answered.  Note: Portions of this report may have been transcribed using voice recognition software. Every effort was made to ensure accuracy; however, inadvertent computerized transcription errors may still be present.    Final Clinical Impressions(s) / ED Diagnoses   Final diagnoses:  Neck pain    ED Discharge Orders        Ordered    lidocaine  (LIDODERM) 5 %  Every 24 hours     04/08/18 2014    methocarbamol (ROBAXIN) 500 MG tablet  2 times daily     04/08/18 2014       Elizabeth Palau 04/09/18 0037    Pricilla Loveless, MD 04/09/18 1536

## 2018-04-08 NOTE — ED Notes (Signed)
ED Provider at bedside. 

## 2018-04-08 NOTE — ED Notes (Signed)
Pt has been using ibuprofen, ice and heat with no relief.

## 2018-04-11 ENCOUNTER — Other Ambulatory Visit: Payer: Self-pay

## 2018-04-11 ENCOUNTER — Encounter: Payer: Self-pay | Admitting: Emergency Medicine

## 2018-04-11 ENCOUNTER — Emergency Department (INDEPENDENT_AMBULATORY_CARE_PROVIDER_SITE_OTHER)
Admission: EM | Admit: 2018-04-11 | Discharge: 2018-04-11 | Disposition: A | Discharge status: Home / Self Care | Payer: 59 | Source: Home / Self Care

## 2018-04-11 DIAGNOSIS — M436 Torticollis: Secondary | ICD-10-CM

## 2018-04-11 MED ORDER — PREDNISONE 20 MG PO TABS: ORAL_TABLET | ORAL | 1 refills | Status: DC

## 2018-04-11 MED ORDER — PREDNISONE 20 MG PO TABS
ORAL_TABLET | ORAL | 1 refills | Status: DC
Start: 2018-04-11 — End: 2018-08-04

## 2018-04-11 NOTE — ED Triage Notes (Signed)
Patient awoke 2 weeks ago with pain at base of neck and across upper shoulders; went to ED 4 days ago and has been taking Robaxin, tramadol, and ibuprofen without relief.

## 2018-04-11 NOTE — ED Provider Notes (Signed)
[facility]    CSN: [id number] Arrival date & time: 04/11/18  1635     History   Chief Complaint Chief Complaint  Patient presents with  . Neck Pain  . Shoulder Pain    HPI Alicia Solomon is a 47 y.o. female.   HPI Alicia Solomon presents with neck pain.  She was seen several days ago for the same problem.  Please see the note below:  Alicia Solomon is a 47 y.o. female presenting for 2 weeks of left sided neck and left trapezius pain.  Patient states the pain is 6/10 in severity, throbbing in nature and occasionally will radiate to her left shoulder.  Patient states that she has been taking ibuprofen for her pain without relief.  Patient states that pain is worse with turning her head to the left and shrugging her left shoulder.  Patient denies weakness/numbing/tingling to her extremities.  Patient denies injury/fall or history of trauma.  Pain began two weeks ago after waking up with the pain.  She is currently taking the Robaxin prescribed in the ED 3 days ago along with Ibuprofen and Tramadol.  Patient works as a Engineer, civil (consulting)nurse Past Medical History:  Diagnosis Date  . Heart murmur   . Hypertension     Patient Active Problem List   Diagnosis Date Noted  . Dysuria 07/14/2016  . Unspecified essential hypertension 02/25/2013  . BMI 33.0-33.9,adult 02/25/2013    Past Surgical History:  Procedure Laterality Date  . ABDOMINAL HYSTERECTOMY  2008  . CESAREAN SECTION  1988  . CHOLECYSTECTOMY  1997  . ECTOPIC PREGNANCY SURGERY  1995    OB History   None      Home Medications    Prior to Admission medications   Medication Sig Start Date End Date Taking? Authorizing Provider  lidocaine (LIDODERM) 5 % Place 1 patch onto the skin daily. Remove & Discard patch within 12 hours or as directed by MD 04/08/18   Bill SalinasMorelli, Brandon A, PA-C  predniSONE (DELTASONE) 20 MG tablet 2 daily with food 04/11/18   Elvina SidleLauenstein, Kenyata Napier, MD    Family History Family History  Problem Relation Age  of Onset  . Hypertension Mother   . Colon cancer Maternal Aunt   . Breast cancer Maternal Aunt   . Prostate cancer Maternal Uncle   . Hyperlipidemia Maternal Aunt     Social History Social History   Tobacco Use  . Smoking status: Never Smoker  . Smokeless tobacco: Never Used  Substance Use Topics  . Alcohol use: Yes    Comment: occ  . Drug use: No     Allergies   Patient has no known allergies.   Review of Systems Review of Systems   Physical Exam Triage Vital Signs ED Triage Vitals  Enc Vitals Group     BP      Pulse      Resp      Temp      Temp src      SpO2      Weight      Height      Head Circumference      Peak Flow      Pain Score      Pain Loc      Pain Edu?      Excl. in GC?    No data found.  Updated Vital Signs BP 103/71 (BP Location: Right Arm)   Pulse (!) 57   Temp 97.7 F (36.5 C) (Oral)  Resp 16   Ht 5\' 4"  (1.626 m)   Wt 88 kg   SpO2 100%   BMI 33.30 kg/m      Physical Exam  Constitutional: She is oriented to person, place, and time. She appears well-developed and well-nourished.  HENT:  Head: Normocephalic.  Right Ear: External ear normal.  Left Ear: External ear normal.  Eyes: Pupils are equal, round, and reactive to light. Conjunctivae are normal.  Neck:  Patient has pain on rotating her neck 50 degrees either direction, flexing it more than 20 degrees, extending it more than 20 degrees, or side bending either way 10 degrees  She has pain with deep palpation at the base of the left cervical spine posteriorly  Pulmonary/Chest: Effort normal.  Musculoskeletal: She exhibits tenderness. She exhibits no deformity.  Neurological: She is alert and oriented to person, place, and time.  Skin: Skin is warm.  Psychiatric: She has a normal mood and affect. Her behavior is normal. Judgment and thought content normal.  Nursing note and vitals reviewed.    UC Treatments / Results  Labs (all labs ordered are listed, but only  abnormal results are displayed) Labs Reviewed - No data to display  EKG None  Radiology No results found.  Procedures Procedures (including critical care time)  Medications Ordered in UC Medications - No data to display  Initial Impression / Assessment and Plan / UC Course  I have reviewed the triage vital signs and the nursing notes.  Pertinent labs & imaging results that were available during my care of the patient were reviewed by me and considered in my medical decision making (see chart for details).      Final Clinical Impressions(s) / UC Diagnoses   Final diagnoses:  Torticollis     Discharge Instructions     Use a towel roll to help with range of motion Get a soft rubber ball or rubber triangle to press pressure on the sore area     ED Prescriptions    Medication Sig Dispense Auth. Provider   predniSONE (DELTASONE) 20 MG tablet 2 daily with food 10 tablet Elvina Sidle, MD     Controlled Substance Prescriptions [place] Controlled Substance Registry consulted? Not Applicable   Elvina Sidle, MD 04/11/18 1710

## 2018-04-11 NOTE — Discharge Instructions (Addendum)
Use a towel roll to help with range of motion Get a soft rubber ball or rubber triangle to press pressure on the sore area

## 2018-08-04 ENCOUNTER — Other Ambulatory Visit: Payer: Self-pay

## 2018-08-04 ENCOUNTER — Emergency Department (INDEPENDENT_AMBULATORY_CARE_PROVIDER_SITE_OTHER)
Admission: EM | Admit: 2018-08-04 | Discharge: 2018-08-04 | Disposition: A | Discharge status: Home / Self Care | Payer: 59 | Source: Home / Self Care | Attending: Family Medicine | Admitting: Family Medicine

## 2018-08-04 ENCOUNTER — Other Ambulatory Visit (HOSPITAL_COMMUNITY)
Admission: RE | Admit: 2018-08-04 | Discharge: 2018-08-04 | Disposition: A | Discharge status: Home / Self Care | Payer: 59 | Source: Ambulatory Visit | Attending: Family Medicine | Admitting: Family Medicine

## 2018-08-04 ENCOUNTER — Encounter: Payer: Self-pay | Admitting: Emergency Medicine

## 2018-08-04 DIAGNOSIS — N898 Other specified noninflammatory disorders of vagina: Secondary | ICD-10-CM | POA: Diagnosis present

## 2018-08-04 DIAGNOSIS — N760 Acute vaginitis: Secondary | ICD-10-CM | POA: Diagnosis not present

## 2018-08-04 LAB — POCT URINALYSIS DIP (MANUAL ENTRY)
Bilirubin, UA: NEGATIVE
Blood, UA: NEGATIVE
Glucose, UA: NEGATIVE mg/dL
Ketones, POC UA: NEGATIVE mg/dL
Nitrite, UA: NEGATIVE
Protein Ur, POC: NEGATIVE mg/dL
Spec Grav, UA: 1.02 (ref 1.010–1.025)
Urobilinogen, UA: 0.2 E.U./dL
pH, UA: 5.5 (ref 5.0–8.0)

## 2018-08-04 MED ORDER — FLUCONAZOLE 150 MG PO TABS: 150.0000 mg | ORAL_TABLET | Freq: Once | ORAL | 0 refills | Status: AC

## 2018-08-04 NOTE — ED Triage Notes (Signed)
Here with vaginal itching, slight redness and irritation. Started 2 dys ago. Denies d/c or pain.

## 2018-08-04 NOTE — ED Provider Notes (Addendum)
[facility]    CSN: [id number] Arrival date & time: 08/04/18  1350     History   Chief Complaint Chief Complaint  Patient presents with  . Vaginal Itching    HPI Alicia Solomon is a 47 y.o. female.   HPI Alicia Solomon is a 47 y.o. female presenting to UC with c/o vaginal itching, irritation and redness for about 2 days. Hx of BV and yeast infections in the past. Symptoms feel similar to prior yeast infections. No recent antibiotic use but she did change her toilet paper recently and notes she is very sensitive to change. Last episode of yeast infection was due to a new soap. She has had unprotected intercourse with a known partner but would like to be checked for gonorrhea and chlamydia while she is here.  Denies urinary symptoms. Denies fever, chills, n/v/d. She has not tried anything for her symptoms.    Past Medical History:  Diagnosis Date  . Heart murmur   . Hypertension     Patient Active Problem List   Diagnosis Date Noted  . Dysuria 07/14/2016  . Unspecified essential hypertension 02/25/2013  . BMI 33.0-33.9,adult 02/25/2013    Past Surgical History:  Procedure Laterality Date  . ABDOMINAL HYSTERECTOMY  2008  . CESAREAN SECTION  1988  . CHOLECYSTECTOMY  1997  . ECTOPIC PREGNANCY SURGERY  1995    OB History   None      Home Medications    Prior to Admission medications   Medication Sig Start Date End Date Taking? Authorizing Provider  fluconazole (DIFLUCAN) 150 MG tablet Take 1 tablet (150 mg total) by mouth once for 1 dose. 08/04/18 08/04/18  Lurene ShadowPhelps, Kyngston Pickelsimer O, PA-C    Family History Family History  Problem Relation Age of Onset  . Hypertension Mother   . Colon cancer Maternal Aunt   . Breast cancer Maternal Aunt   . Prostate cancer Maternal Uncle   . Hyperlipidemia Maternal Aunt     Social History Social History   Tobacco Use  . Smoking status: Never Smoker  . Smokeless tobacco: Never Used  Substance Use Topics  . Alcohol use:  Yes    Comment: occ  . Drug use: No     Allergies   Patient has no known allergies.   Review of Systems Review of Systems  Gastrointestinal: Negative for abdominal pain, diarrhea, nausea and vomiting.  Genitourinary: Positive for vaginal pain (itching, burning). Negative for dysuria, flank pain, frequency, pelvic pain, urgency, vaginal bleeding and vaginal discharge.  Musculoskeletal: Negative for back pain and myalgias.     Physical Exam Triage Vital Signs ED Triage Vitals [08/04/18 1428]  Enc Vitals Group     BP 110/71     Pulse Rate 77     Resp      Temp 98.2 F (36.8 C)     Temp Source Oral     SpO2 97 %     Weight 203 lb 12.8 oz (92.4 kg)     Height 5\' 4"  (1.626 m)     Head Circumference      Peak Flow      Pain Score 0     Pain Loc      Pain Edu?      Excl. in GC?    No data found.  Updated Vital Signs BP 110/71 (BP Location: Left Arm)   Pulse 77   Temp 98.2 F (36.8 C) (Oral)   Ht 5\' 4"  (1.626 m)  Wt 203 lb 12.8 oz (92.4 kg)   SpO2 97%   BMI 34.98 kg/m   Visual Acuity Right Eye Distance:   Left Eye Distance:   Bilateral Distance:    Right Eye Near:   Left Eye Near:    Bilateral Near:     Physical Exam  Constitutional: She is oriented to person, place, and time. She appears well-developed and well-nourished.  HENT:  Head: Normocephalic and atraumatic.  Mouth/Throat: Oropharynx is clear and moist.  Eyes: EOM are normal.  Neck: Normal range of motion.  Cardiovascular: Normal rate and regular rhythm.  Pulmonary/Chest: Effort normal. No respiratory distress.  Genitourinary:  Genitourinary Comments: Deferred  Musculoskeletal: Normal range of motion.  Neurological: She is alert and oriented to person, place, and time.  Skin: Skin is warm and dry.  Psychiatric: She has a normal mood and affect. Her behavior is normal.  Nursing note and vitals reviewed.    UC Treatments / Results  Labs (all labs ordered are listed, but only abnormal  results are displayed) Labs Reviewed  POCT URINALYSIS DIP (MANUAL ENTRY) - Abnormal; Notable for the following components:      Result Value   Leukocytes, UA Trace (*)    All other components within normal limits  CERVICOVAGINAL ANCILLARY ONLY    EKG None  Radiology No results found.  Procedures Procedures (including critical care time)  Medications Ordered in UC Medications - No data to display  Initial Impression / Assessment and Plan / UC Course  I have reviewed the triage vital signs and the nursing notes.  Pertinent labs & imaging results that were available during my care of the patient were reviewed by me and considered in my medical decision making (see chart for details).    Pt performed self-swab Swab sent to lab. Home care info provided.    Final Clinical Impressions(s) / UC Diagnoses   Final diagnoses:  Vaginal itching  Vaginitis and vulvovaginitis     Discharge Instructions      You may start the diflucan for a suspected yeast infection or wait for the test results to come back in about 2-3 days. If additional medication is indicated, it will be called into your preferred pharmacy.     ED Prescriptions    Medication Sig Dispense Auth. Provider   fluconazole (DIFLUCAN) 150 MG tablet Take 1 tablet (150 mg total) by mouth once for 1 dose. 1 tablet Lurene Shadow, PA-C     Controlled Substance Prescriptions [place] Controlled Substance Registry consulted? Not Applicable   Rolla Plate 08/04/18 1507    Lurene Shadow, PA-C 08/04/18 [phone]

## 2018-08-04 NOTE — Discharge Instructions (Signed)
°  You may start the diflucan for a suspected yeast infection or wait for the test results to come back in about 2-3 days. If additional medication is indicated, it will be called into your preferred pharmacy.

## 2018-08-06 ENCOUNTER — Telehealth: Payer: Self-pay

## 2018-08-06 LAB — CERVICOVAGINAL ANCILLARY ONLY: Wet Prep (BD Affirm): POSITIVE — AB

## 2018-08-06 MED ORDER — METRONIDAZOLE 500 MG PO TABS: 500.0000 mg | ORAL_TABLET | Freq: Two times a day (BID) | ORAL | 0 refills | Status: DC

## 2018-08-06 NOTE — Telephone Encounter (Signed)
Left message for patient to call for lab results and to find out what pharmacy medication needs to be sent to.  Dr Cathren HarshBeese would like Flagyl 500 mg BID x 7 days #14 to be sent to pharmacy of choice.

## 2018-08-06 NOTE — Telephone Encounter (Signed)
Pt called back and was given lab results-Flagyl called in to pharmacy.

## 2018-08-19 ENCOUNTER — Emergency Department (INDEPENDENT_AMBULATORY_CARE_PROVIDER_SITE_OTHER)
Admission: EM | Admit: 2018-08-19 | Discharge: 2018-08-19 | Disposition: A | Discharge status: Home / Self Care | Payer: No Typology Code available for payment source | Source: Home / Self Care | Attending: Emergency Medicine | Admitting: Emergency Medicine

## 2018-08-19 ENCOUNTER — Emergency Department (INDEPENDENT_AMBULATORY_CARE_PROVIDER_SITE_OTHER): Payer: No Typology Code available for payment source

## 2018-08-19 ENCOUNTER — Encounter: Payer: Self-pay | Admitting: Emergency Medicine

## 2018-08-19 DIAGNOSIS — M25521 Pain in right elbow: Secondary | ICD-10-CM

## 2018-08-19 DIAGNOSIS — M25562 Pain in left knee: Secondary | ICD-10-CM

## 2018-08-19 DIAGNOSIS — M7711 Lateral epicondylitis, right elbow: Secondary | ICD-10-CM

## 2018-08-19 MED ORDER — IBUPROFEN 600 MG PO TABS: 600.0000 mg | ORAL_TABLET | Freq: Three times a day (TID) | ORAL | 0 refills | Status: DC | PRN

## 2018-08-19 NOTE — ED Provider Notes (Signed)
[facility]    CSN: [id number] Arrival date & time: 08/19/18  1317     History   Chief Complaint Chief Complaint  Patient presents with  . Knee Pain   2 separate chief complaints: #1 right elbow pain for 2 months.  #2 left knee pain for 2 days. HPI Alicia Solomon is a 47 y.o. female.   HPI #1 right elbow pain for 2 months. Recalls no trauma, but has done some repetitive motions vacuuming at home the past few months. She works as a Research scientist (medical) at [employer], but does not recall lifting any patients there. Complains of sharp right lateral elbow pain without swelling.  For 2 months.  No radiation.  Worse with bending right elbow and with handgrip and especially worse gripping with right hand and bending and turning right forearm at the same time. No associated weakness or numbness or paresthesias or radiation. No other symptoms or pain of the right upper extremity.  No shoulder or wrist or hand symptoms right upper extremity. Past medical history: No history of arthritis or gout. Positive for hypertension.  #2 left knee pain for 2 days. Recalls no trauma.  Complains of moderate to severe left anterior lateral knee pain for 2 days.  Worse with bending or walking.  Tried Motrin without significant relief.  No radiation.  Denies prior knee problems.  Denies left hip or calf or ankle or foot pain, swelling or symptoms.  No numbness or radiation of left knee pain.  Associated symptoms: No chest pain or shortness of breath.  No rash.  No abdominal pain nausea vomiting or diarrhea.  No fever or chills.   Past Medical History:  Diagnosis Date  . Heart murmur   . Hypertension     Patient Active Problem List   Diagnosis Date Noted  . Dysuria 07/14/2016  . Unspecified essential hypertension 02/25/2013  . BMI 33.0-33.9,adult 02/25/2013    Past Surgical History:  Procedure Laterality Date  . ABDOMINAL HYSTERECTOMY  2008  . CESAREAN SECTION  1988  .  CHOLECYSTECTOMY  1997  . ECTOPIC PREGNANCY SURGERY  1995    OB History   No obstetric history on file.    No LMP recorded. Patient has had a hysterectomy.   Home Medications    None  Family History Family History  Problem Relation Age of Onset  . Hypertension Mother   . Colon cancer Maternal Aunt   . Breast cancer Maternal Aunt   . Prostate cancer Maternal Uncle   . Hyperlipidemia Maternal Aunt     Social History Social History   Tobacco Use  . Smoking status: Never Smoker  . Smokeless tobacco: Never Used  Substance Use Topics  . Alcohol use: Yes    Comment: occ  . Drug use: No   Never smoked.  Allergies   Patient has no known allergies.   Review of Systems Review of Systems  Constitutional: Negative for fever.  HENT: Negative.   Eyes: Negative.   Respiratory: Negative for cough and shortness of breath.   Cardiovascular: Negative for chest pain and palpitations.       No leg edema  Gastrointestinal: Negative.   Genitourinary: Negative.   Musculoskeletal: Positive for arthralgias. Negative for back pain, joint swelling, neck pain and neck stiffness.  Neurological: Negative for weakness, light-headedness and numbness.  Psychiatric/Behavioral: Negative for confusion.  All other systems reviewed and are negative. See also HPI   Physical Exam Triage Vital Signs ED Triage Vitals  Enc Vitals Group     BP 08/19/18 1340 115/79     Pulse Rate 08/19/18 1340 60     Resp --      Temp 08/19/18 1340 98 F (36.7 C)     Temp Source 08/19/18 1340 Oral     SpO2 08/19/18 1340 100 %     Weight 08/19/18 1341 205 lb (93 kg)     Height --      Head Circumference --      Peak Flow --      Pain Score 08/19/18 1341 8     Pain Loc --      Pain Edu? --      Excl. in GC? --    No data found.  Updated Vital Signs BP 115/79 (BP Location: Right Arm)   Pulse 60   Temp 98 F (36.7 C) (Oral)   Wt 93 kg   SpO2 100%   BMI 35.19 kg/m    Physical Exam Vitals signs  reviewed.  Constitutional:      General: She is not in acute distress.    Appearance: She is well-developed.  HENT:     Head: Normocephalic and atraumatic.  Eyes:     General: No scleral icterus.    Pupils: Pupils are equal, round, and reactive to light.  Neck:     Musculoskeletal: Normal range of motion and neck supple.  Cardiovascular:     Rate and Rhythm: Normal rate and regular rhythm.  Pulmonary:     Effort: Pulmonary effort is normal.  Abdominal:     General: There is no distension.  Musculoskeletal:     Right elbow: She exhibits decreased range of motion. She exhibits no swelling, no deformity and no laceration. Tenderness found. Lateral epicondyle tenderness noted.     Left knee: She exhibits decreased range of motion, swelling (Minimal), laceration and bony tenderness (Mild to moderate tenderness superior patella at insertion of quadriceps). She exhibits no ecchymosis, normal alignment, no LCL laxity, normal meniscus and no MCL laxity. Tenderness found. Medial joint line and MCL tenderness noted. No patellar tendon tenderness noted.     Comments: Left knee: No instability.  Negative anterior drawer sign.  Negative McMurray's test.  Right elbow: Very tender over lateral epicondyle, pain exacerbated by handgrip or torsion of forearm and bending elbow. No ecchymosis  Skin:    General: Skin is warm and dry.  Neurological:     Mental Status: She is alert and oriented to person, place, and time.     Cranial Nerves: No cranial nerve deficit.  Psychiatric:        Behavior: Behavior normal.    2:02 PM discussed with patient after history and physical obtained. She agrees with ordering x-rays right elbow and left knee.  UC Treatments / Results  Labs (all labs ordered are listed, but only abnormal results are displayed) Labs Reviewed - No data to display  EKG None  Radiology Dg Elbow Complete Right  Result Date: 08/19/2018 CLINICAL DATA:  Lateral right elbow pain for the  past 2 months. Repetitive motion. No direct injury. EXAM: RIGHT ELBOW - COMPLETE 3+ VIEW COMPARISON:  02/25/2017. FINDINGS: There is no evidence of fracture, dislocation, or joint effusion. There is no evidence of arthropathy or other focal bone abnormality. Soft tissues are unremarkable. IMPRESSION: Normal examination. Electronically Signed   By: Beckie Salts M.D.   On: 08/19/2018 14:20   Dg Knee Complete 4 Views Left  Result Date: 08/19/2018 CLINICAL DATA:  Left knee pain and swelling for the past 3 days. No reported injury. EXAM: LEFT KNEE - COMPLETE 4+ VIEW COMPARISON:  None. FINDINGS: No evidence of fracture, dislocation, or joint effusion. No evidence of arthropathy or other focal bone abnormality. Soft tissues are unremarkable. IMPRESSION: Normal examination. Electronically Signed   By: Beckie SaltsSteven  Reid M.D.   On: 08/19/2018 14:21    Procedures Procedures (including critical care time)  Medications Ordered in UC Medications - No data to display  Initial Impression / Assessment and Plan / UC Course  I have reviewed the triage vital signs and the nursing notes.  Pertinent labs & imaging results that were available during my care of the patient were reviewed by me and considered in my medical decision making (see chart for details).       Final Clinical Impressions(s) / UC Diagnoses   Final diagnoses:  Lateral epicondylitis of right elbow  Acute pain of left knee  Likely has lateral epicondylitis right elbow from repetitive overuse over the past several months.  Reviewed that x-ray was negative.  We discussed relative rest.  Tennis elbow strap advised, she may purchase that OTC.  Today, I applied Ace right elbow.  She declined any other pain med other than ibuprofen.  Prescription sent to her pharmacy for ibuprofen after precautions discussed.  Follow-up with PCP or orthopedist if symptoms persist for more than 2 weeks.  Second problem of acute pain left knee.  Likely has overuse injury,  tendinitis and likely mild osteoarthritis flareup.  Reviewed x-ray left knee negative.  Ace bandage applied left knee.  She declined knee immobilizer. Ibuprofen prescribed.Follow-up with PCP or orthopedist if symptoms persist for more than 2 weeks.  ED Prescriptions    Medication Sig Dispense Auth. Provider   ibuprofen (ADVIL,MOTRIN) 600 MG tablet Take 1 tablet (600 mg total) by mouth every 8 (eight) hours as needed for moderate pain. 30 tablet Lajean ManesMassey, David, MD     Above instructions reviewed at length.  I gave her printed copy of x-ray reports. Precautions discussed. Red flags discussed. Questions invited and answered. Patient voiced understanding and agreement.    Lajean ManesMassey, David, MD 08/19/18 [phone]

## 2018-08-19 NOTE — ED Triage Notes (Signed)
Pt c/o left knee pain and swelling x2 days. Denies injury pain is worse with bending. She has tried ice and motrin.

## 2018-12-26 DIAGNOSIS — F411 Generalized anxiety disorder: Secondary | ICD-10-CM | POA: Insufficient documentation

## 2018-12-26 DIAGNOSIS — G4700 Insomnia, unspecified: Secondary | ICD-10-CM | POA: Insufficient documentation

## 2018-12-26 DIAGNOSIS — E6609 Other obesity due to excess calories: Secondary | ICD-10-CM | POA: Insufficient documentation

## 2018-12-26 DIAGNOSIS — E66811 Obesity, class 1: Secondary | ICD-10-CM | POA: Insufficient documentation

## 2019-02-11 DIAGNOSIS — F321 Major depressive disorder, single episode, moderate: Secondary | ICD-10-CM | POA: Insufficient documentation

## 2019-07-01 IMAGING — DX DG KNEE COMPLETE 4+V*L*
4 series · 4 of 4 positions shown · non-contrast
Comparison: None.

CLINICAL DATA: Left knee pain and swelling for the past 3 days. No
reported injury.

EXAM:
LEFT KNEE - COMPLETE 4+ VIEW

[knee ap]
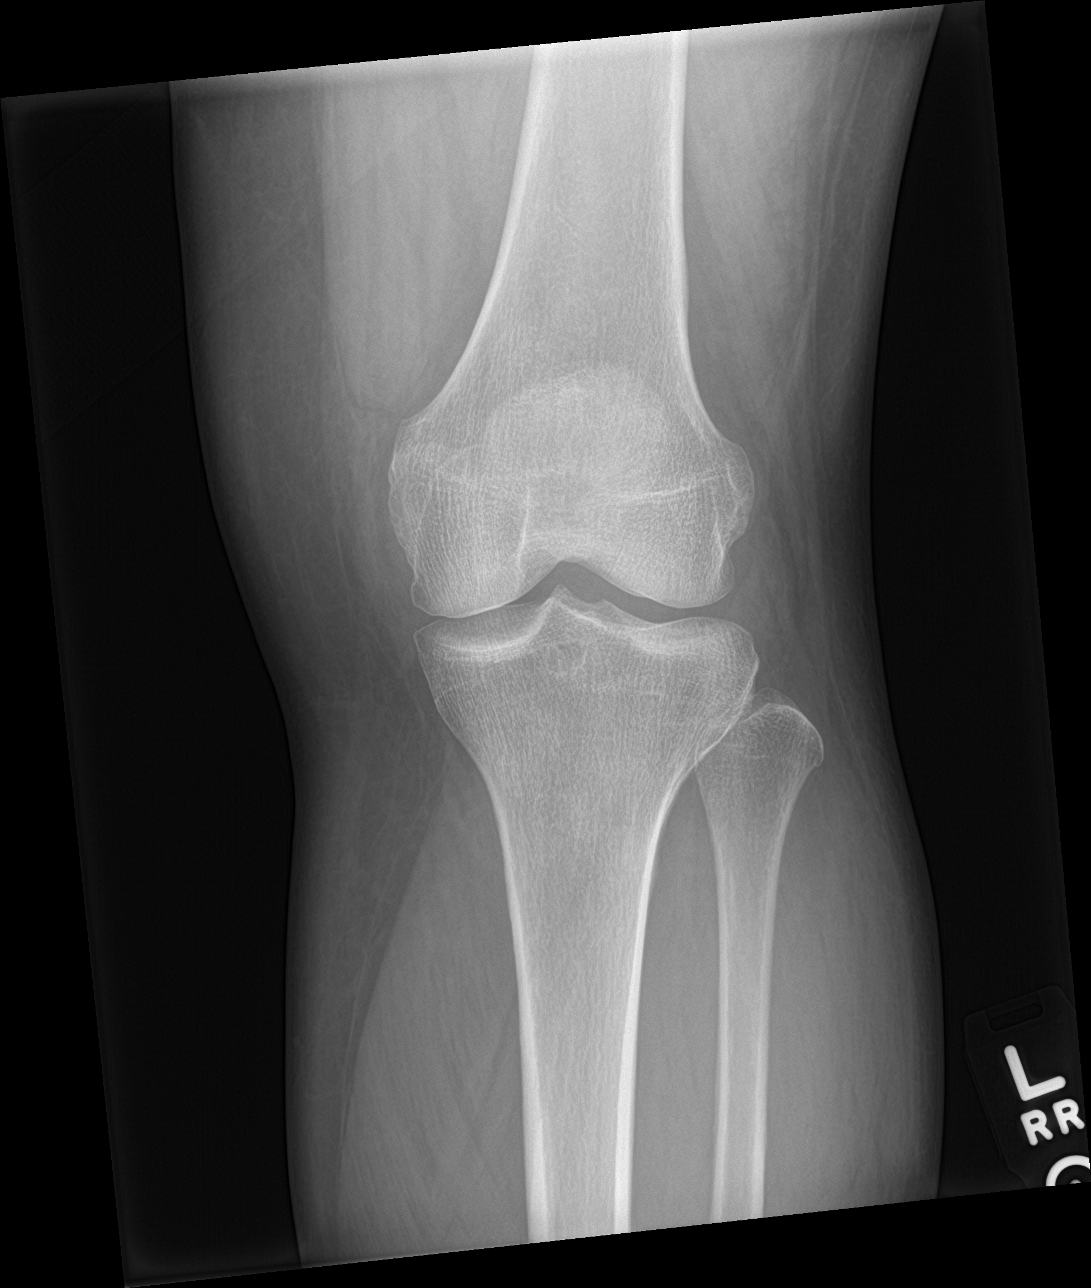

[knee lat]
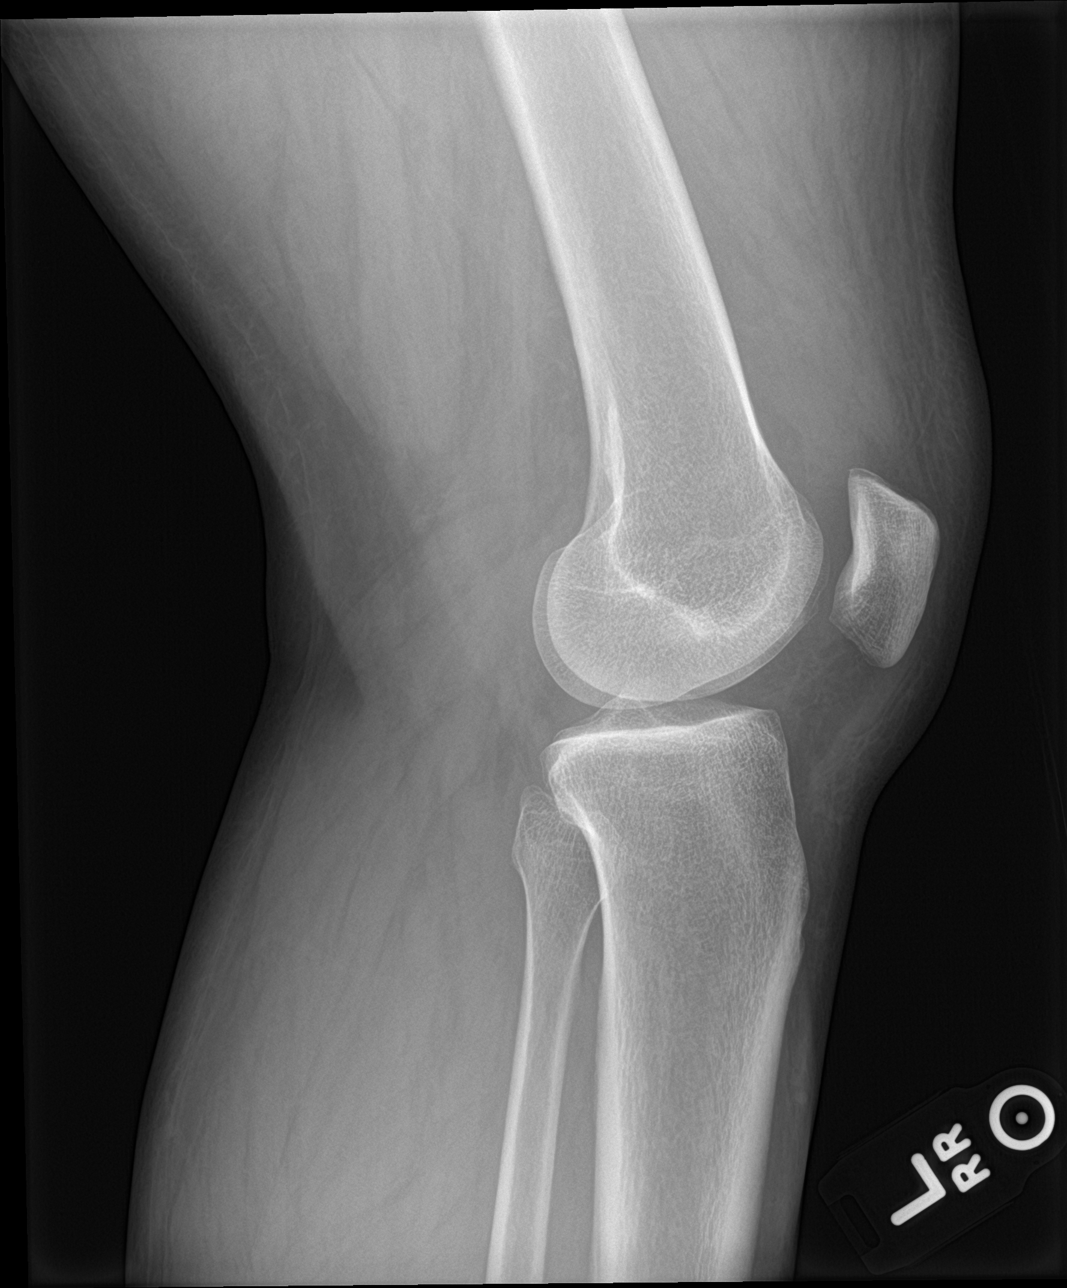

[knee obl (1 of 2)]
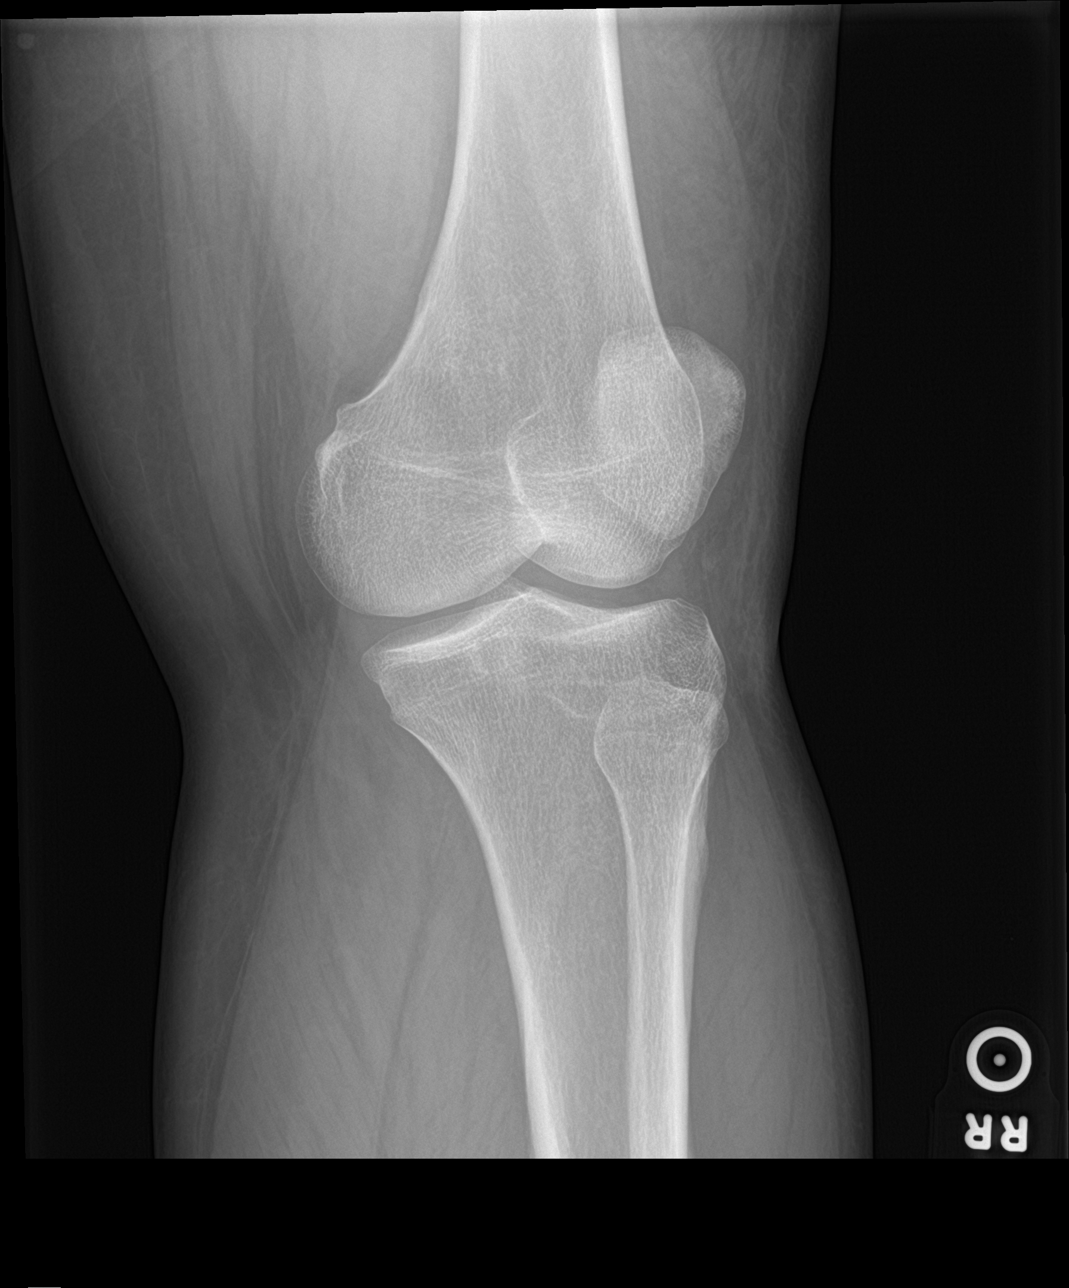

[knee obl (2 of 2)]
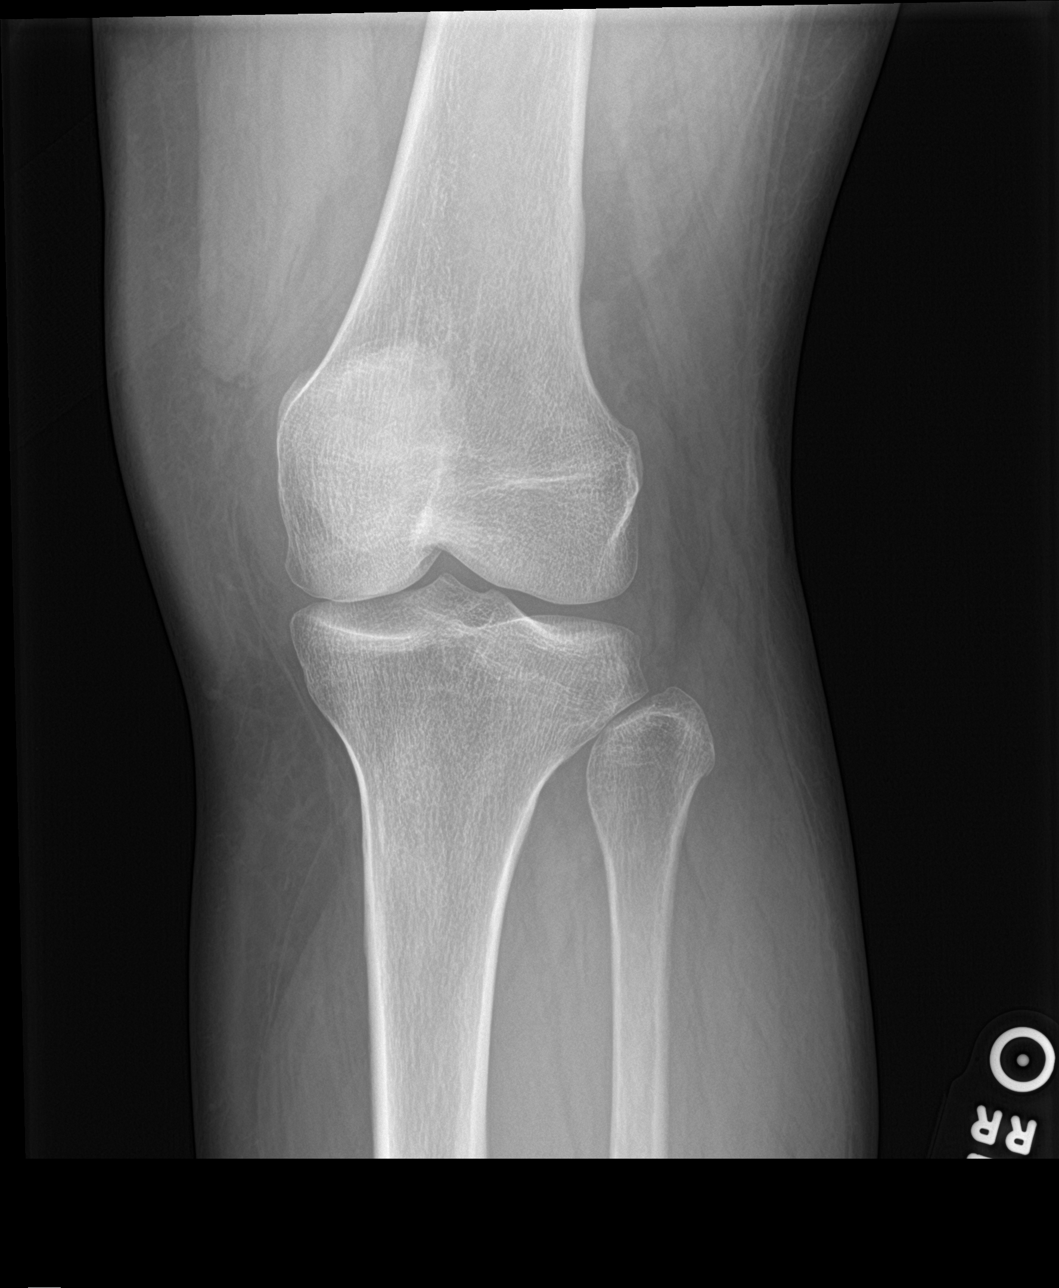

[4 of 4 positions shown; findings below may reference images not displayed]

FINDINGS: No evidence of fracture, dislocation, or joint effusion. No evidence
of arthropathy or other focal bone abnormality. Soft tissues are
unremarkable.
IMPRESSION: Normal examination.

## 2019-11-19 DIAGNOSIS — Z90710 Acquired absence of both cervix and uterus: Secondary | ICD-10-CM | POA: Insufficient documentation

## 2020-04-27 ENCOUNTER — Other Ambulatory Visit: Payer: Self-pay

## 2020-04-27 ENCOUNTER — Other Ambulatory Visit (HOSPITAL_COMMUNITY)
Admission: RE | Admit: 2020-04-27 | Discharge: 2020-04-27 | Disposition: A | Discharge status: Home / Self Care | Payer: No Typology Code available for payment source | Source: Ambulatory Visit | Attending: Family Medicine | Admitting: Family Medicine

## 2020-04-27 ENCOUNTER — Emergency Department (INDEPENDENT_AMBULATORY_CARE_PROVIDER_SITE_OTHER)
Admission: RE | Admit: 2020-04-27 | Discharge: 2020-04-27 | Disposition: A | Discharge status: Home / Self Care | Payer: No Typology Code available for payment source | Source: Ambulatory Visit

## 2020-04-27 VITALS — BP 129/84 | HR 61 | Temp 99.0°F | Resp 18 | Ht 64.0 in | Wt 195.0 lb

## 2020-04-27 DIAGNOSIS — N898 Other specified noninflammatory disorders of vagina: Secondary | ICD-10-CM | POA: Insufficient documentation

## 2020-04-27 DIAGNOSIS — Z8742 Personal history of other diseases of the female genital tract: Secondary | ICD-10-CM | POA: Diagnosis not present

## 2020-04-27 DIAGNOSIS — B9689 Other specified bacterial agents as the cause of diseases classified elsewhere: Secondary | ICD-10-CM

## 2020-04-27 DIAGNOSIS — N760 Acute vaginitis: Secondary | ICD-10-CM

## 2020-04-27 MED ORDER — METRONIDAZOLE 500 MG PO TABS: 500.0000 mg | ORAL_TABLET | Freq: Two times a day (BID) | ORAL | 0 refills | Status: DC

## 2020-04-27 MED ORDER — METRONIDAZOLE 0.75 % VA GEL: 1.0000 | Freq: Two times a day (BID) | VAGINAL | 0 refills | Status: DC

## 2020-04-27 NOTE — ED Provider Notes (Signed)
[facility]    CSN: [id number] Arrival date & time: 04/27/20  0946      History   Chief Complaint Chief Complaint  Patient presents with  . Vaginal Discharge    HPI Alicia Solomon is a 49 y.o. female.  Patient presents with chief complaint of vaginal discharge with odor.  Been present for 4 weeks.  Past history of BV as well as trichomonas.  Requests test for GC chlamydia as well   HPI  Past Medical History:  Diagnosis Date  . Heart murmur   . Hypertension     Patient Active Problem List   Diagnosis Date Noted  . Dysuria 07/14/2016  . Unspecified essential hypertension 02/25/2013  . BMI 33.0-33.9,adult 02/25/2013    Past Surgical History:  Procedure Laterality Date  . ABDOMINAL HYSTERECTOMY  2008  . CESAREAN SECTION  1988  . CHOLECYSTECTOMY  1997  . ECTOPIC PREGNANCY SURGERY  1995    OB History   No obstetric history on file.      Home Medications    Prior to Admission medications   Medication Sig Start Date End Date Taking? Authorizing Provider  hydrochlorothiazide (HYDRODIURIL) 25 MG tablet Take 25 mg by mouth daily. 02/04/20   [provider]    Family History Family History  Problem Relation Age of Onset  . Hypertension Mother   . Colon cancer Maternal Aunt   . Breast cancer Maternal Aunt   . Prostate cancer Maternal Uncle   . Hyperlipidemia Maternal Aunt     Social History Social History   Tobacco Use  . Smoking status: Never Smoker  . Smokeless tobacco: Never Used  Vaping Use  . Vaping Use: Never used  Substance Use Topics  . Alcohol use: Yes    Comment: occ  . Drug use: No     Allergies   Patient has no known allergies.   Review of Systems Review of Systems  Genitourinary: Positive for vaginal discharge.  All other systems reviewed and are negative.    Physical Exam Triage Vital Signs ED Triage Vitals  Enc Vitals Group     BP 04/27/20 1000 129/84     Pulse Rate 04/27/20 1000 61     Resp 04/27/20  1000 18     Temp 04/27/20 1000 99 F (37.2 C)     Temp Source 04/27/20 1000 Oral     SpO2 04/27/20 1000 100 %     Weight 04/27/20 0957 195 lb (88.5 kg)     Height 04/27/20 0957 5\' 4"  (1.626 m)     Head Circumference --      Peak Flow --      Pain Score 04/27/20 0957 0     Pain Loc --      Pain Edu? --      Excl. in GC? --    No data found.  Updated Vital Signs BP 129/84 (BP Location: Right Arm)   Pulse 61   Temp 99 F (37.2 C) (Oral)   Resp 18   Ht 5\' 4"  (1.626 m)   Wt 88.5 kg   SpO2 100%   BMI 33.47 kg/m   Visual Acuity Right Eye Distance:   Left Eye Distance:   Bilateral Distance:    Right Eye Near:   Left Eye Near:    Bilateral Near:     Physical Exam Vitals and nursing note reviewed.  Constitutional:      Appearance: Normal appearance.  Cardiovascular:     Rate  and Rhythm: Normal rate and regular rhythm.  Pulmonary:     Effort: Pulmonary effort is normal.     Breath sounds: Normal breath sounds.  Genitourinary:    Comments: Patient performed self collected specimen of discharge.  Will send for chlamydia trichomonas GC BV Neurological:     Mental Status: She is alert.      UC Treatments / Results  Labs (all labs ordered are listed, but only abnormal results are displayed) Labs Reviewed  GC/CHLAMYDIA PROBE AMP ([place]) NOT AT [facility]    EKG   Radiology No results found.  Procedures Procedures (including critical care time)  Medications Ordered in UC Medications - No data to display  Initial Impression / Assessment and Plan / UC Course  I have reviewed the triage vital signs and the nursing notes.  Pertinent labs & imaging results that were available during my care of the patient were reviewed by me and considered in my medical decision making (see chart for details).     Vaginitis.  Based on history with treat for BV Final Clinical Impressions(s) / UC Diagnoses   Final diagnoses:  None   Discharge Instructions   None    ED  Prescriptions    None     PDMP not reviewed this encounter.   Frederica Kuster, MD 04/27/20 1018

## 2020-04-27 NOTE — ED Triage Notes (Signed)
Pt c/o white vaginal discharge and fishy odor x 4 wks after changing soaps. No concern for STD, but would like G/C testing.

## 2020-04-28 LAB — GC/CHLAMYDIA PROBE AMP (~~LOC~~) NOT AT ARMC
Chlamydia: NEGATIVE
Comment: NEGATIVE
Comment: NORMAL
Neisseria Gonorrhea: NEGATIVE

## 2020-10-04 ENCOUNTER — Other Ambulatory Visit: Payer: Self-pay

## 2020-10-04 ENCOUNTER — Encounter: Payer: Self-pay | Admitting: Emergency Medicine

## 2020-10-04 ENCOUNTER — Emergency Department
Admission: EM | Admit: 2020-10-04 | Discharge: 2020-10-04 | Disposition: A | Discharge status: Home / Self Care | Payer: No Typology Code available for payment source | Source: Home / Self Care

## 2020-10-04 ENCOUNTER — Emergency Department: Admit: 2020-10-04 | Discharge status: Absent without leave | Payer: Self-pay

## 2020-10-04 DIAGNOSIS — R053 Chronic cough: Secondary | ICD-10-CM | POA: Diagnosis not present

## 2020-10-04 DIAGNOSIS — J029 Acute pharyngitis, unspecified: Secondary | ICD-10-CM

## 2020-10-04 MED ORDER — BENZONATATE 100 MG PO CAPS: 100.0000 mg | ORAL_CAPSULE | Freq: Three times a day (TID) | ORAL | 0 refills | Status: DC

## 2020-10-04 MED ORDER — AMOXICILLIN-POT CLAVULANATE 875-125 MG PO TABS: 1.0000 | ORAL_TABLET | Freq: Two times a day (BID) | ORAL | 0 refills | Status: DC

## 2020-10-04 NOTE — Discharge Instructions (Signed)
  You may take 500mg acetaminophen every 4-6 hours or in combination with ibuprofen 400-600mg every 6-8 hours as needed for pain, inflammation, and fever.  Be sure to well hydrated with clear liquids and get at least 8 hours of sleep at night, preferably more while sick.   Please follow up with family medicine in 1 week if needed.   

## 2020-10-04 NOTE — ED Triage Notes (Signed)
Cough & congestion x 1 month  Sore throat this weekend  OTC theraflu  Denies fever , but has been taking meds COVID bboster 11/21 Grandsons had COVID 1 month

## 2020-10-04 NOTE — ED Provider Notes (Signed)
[facility]    CSN: [id number] Arrival date & time: 10/04/20  5053      History   Chief Complaint Chief Complaint  Patient presents with  . Cough  . Sore Throat    HPI Alicia Solomon is a 50 y.o. female.   HPI Alicia Solomon is a 50 y.o. female presenting to UC with c/o cough and congestion for about 1 month, developed a sore throat this weekend. Pt states her grandsons had COVID 1 month ago. She did a rapid COVID test around that time, it was negative. She has been taking Theraflu with mild relief. No known fever but has been taking OTC medications. Denies n/v/d. No chest pain or SOB. She had her COVID Booster November 2021.   Past Medical History:  Diagnosis Date  . Heart murmur   . Hypertension     Patient Active Problem List   Diagnosis Date Noted  . Dysuria 07/14/2016  . Unspecified essential hypertension 02/25/2013  . BMI 33.0-33.9,adult 02/25/2013    Past Surgical History:  Procedure Laterality Date  . ABDOMINAL HYSTERECTOMY  2008  . CESAREAN SECTION  1988  . CHOLECYSTECTOMY  1997  . ECTOPIC PREGNANCY SURGERY  1995    OB History   No obstetric history on file.      Home Medications    Prior to Admission medications   Medication Sig Start Date End Date Taking? Authorizing Provider  amoxicillin-clavulanate (AUGMENTIN) 875-125 MG tablet Take 1 tablet by mouth 2 (two) times daily. One po bid x 7 days 10/04/20  Yes Ceci Taliaferro O, PA-C  hydrochlorothiazide (HYDRODIURIL) 25 MG tablet Take 25 mg by mouth daily. 02/04/20  Yes [provider]  benzonatate (TESSALON) 100 MG capsule Take 1 capsule (100 mg total) by mouth every 8 (eight) hours. 10/04/20   Lurene Shadow, PA-C  metroNIDAZOLE (METROGEL VAGINAL) 0.75 % vaginal gel Place 1 Applicatorful vaginally 2 (two) times daily. Patient not taking: Reported on 10/04/2020 04/27/20   Frederica Kuster, MD    Family History Family History  Problem Relation Age of Onset  . Hypertension Mother   .  Colon cancer Maternal Aunt   . Breast cancer Maternal Aunt   . Prostate cancer Maternal Uncle   . Hyperlipidemia Maternal Aunt     Social History Social History   Tobacco Use  . Smoking status: Never Smoker  . Smokeless tobacco: Never Used  Vaping Use  . Vaping Use: Never used  Substance Use Topics  . Alcohol use: Yes    Comment: occ  . Drug use: No     Allergies   Melatonin, Bupropion, and Duloxetine   Review of Systems Review of Systems  Constitutional: Negative for chills and fever.  HENT: Positive for congestion and sore throat. Negative for ear pain, trouble swallowing and voice change.   Respiratory: Positive for cough. Negative for shortness of breath.   Cardiovascular: Negative for chest pain and palpitations.  Gastrointestinal: Negative for abdominal pain, diarrhea, nausea and vomiting.  Musculoskeletal: Negative for arthralgias, back pain and myalgias.  Skin: Negative for rash.  All other systems reviewed and are negative.    Physical Exam Triage Vital Signs ED Triage Vitals  Enc Vitals Group     BP 10/04/20 0840 126/89     Pulse Rate 10/04/20 0840 70     Resp 10/04/20 0840 15     Temp 10/04/20 0840 98.8 F (37.1 C)     Temp Source 10/04/20 0840 Oral  SpO2 10/04/20 0840 100 %     Weight 10/04/20 0844 201 lb (91.2 kg)     Height 10/04/20 0844 5\' 4"  (1.626 m)     Head Circumference --      Peak Flow --      Pain Score 10/04/20 0842 0     Pain Loc --      Pain Edu? --      Excl. in GC? --    No data found.  Updated Vital Signs BP 126/89 (BP Location: Right Arm)   Pulse 70   Temp 98.8 F (37.1 C) (Oral)   Resp 15   Ht 5\' 4"  (1.626 m)   Wt 201 lb (91.2 kg)   SpO2 100%   BMI 34.50 kg/m   Visual Acuity Right Eye Distance:   Left Eye Distance:   Bilateral Distance:    Right Eye Near:   Left Eye Near:    Bilateral Near:     Physical Exam Vitals and nursing note reviewed.  Constitutional:      General: She is not in acute  distress.    Appearance: She is well-developed and well-nourished. She is not ill-appearing, toxic-appearing or diaphoretic.  HENT:     Head: Normocephalic and atraumatic.     Right Ear: Tympanic membrane and ear canal normal.     Left Ear: Tympanic membrane and ear canal normal.     Nose: Nose normal.     Right Sinus: No maxillary sinus tenderness or frontal sinus tenderness.     Left Sinus: No maxillary sinus tenderness or frontal sinus tenderness.     Mouth/Throat:     Lips: Pink.     Mouth: Mucous membranes are moist.     Pharynx: Oropharynx is clear. Uvula midline.  Eyes:     Extraocular Movements: EOM normal.  Cardiovascular:     Rate and Rhythm: Normal rate and regular rhythm.  Pulmonary:     Effort: Pulmonary effort is normal. No respiratory distress.     Breath sounds: Normal breath sounds. No stridor. No wheezing, rhonchi or rales.  Musculoskeletal:        General: Normal range of motion.     Cervical back: Normal range of motion and neck supple.  Lymphadenopathy:     Cervical: No cervical adenopathy.  Skin:    General: Skin is warm and dry.  Neurological:     Mental Status: She is alert and oriented to person, place, and time.  Psychiatric:        Mood and Affect: Mood and affect normal.        Behavior: Behavior normal.      UC Treatments / Results  Labs (all labs ordered are listed, but only abnormal results are displayed) Labs Reviewed  SARS-COV-2 RNA,(COVID-19) QUALITATIVE NAAT    EKG   Radiology No results found.  Procedures Procedures (including critical care time)  Medications Ordered in UC Medications - No data to display  Initial Impression / Assessment and Plan / UC Course  I have reviewed the triage vital signs and the nursing notes.  Pertinent labs & imaging results that were available during my care of the patient were reviewed by me and considered in my medical decision making (see chart for details).     Lungs, CTAB, however due  to persistence of cough, will cover for bacterial infection.  Discussed CXR, will hold off for now but recommended if cough persist despite antibiotic prescribed today COVID PCR pending F/u with PCP  as needed  Final Clinical Impressions(s) / UC Diagnoses   Final diagnoses:  Persistent cough for 3 weeks or longer  Acute pharyngitis, unspecified etiology     Discharge Instructions      You may take 500mg  acetaminophen every 4-6 hours or in combination with ibuprofen 400-600mg  every 6-8 hours as needed for pain, inflammation, and fever.  Be sure to well hydrated with clear liquids and get at least 8 hours of sleep at night, preferably more while sick.   Please follow up with family medicine in 1 week if needed.     ED Prescriptions    Medication Sig Dispense Auth. Provider   amoxicillin-clavulanate (AUGMENTIN) 875-125 MG tablet Take 1 tablet by mouth 2 (two) times daily. One po bid x 7 days 14 tablet Lauralynn Loeb O, PA-C   benzonatate (TESSALON) 100 MG capsule  (Status: Discontinued) Take 1 capsule (100 mg total) by mouth every 8 (eight) hours. 21 capsule 04-16-1987, Selma Mink O, PA-C   benzonatate (TESSALON) 100 MG capsule Take 1 capsule (100 mg total) by mouth every 8 (eight) hours. 21 capsule Doroteo Glassman, Lurene Shadow     PDMP not reviewed this encounter.   [place], Lurene Shadow 10/04/20 [phone]

## 2020-10-06 LAB — SARS-COV-2 RNA,(COVID-19) QUALITATIVE NAAT: SARS CoV2 RNA: NOT DETECTED

## 2020-10-21 ENCOUNTER — Emergency Department (INDEPENDENT_AMBULATORY_CARE_PROVIDER_SITE_OTHER)
Admission: RE | Admit: 2020-10-21 | Discharge: 2020-10-21 | Disposition: A | Discharge status: Home / Self Care | Payer: No Typology Code available for payment source | Source: Ambulatory Visit

## 2020-10-21 ENCOUNTER — Other Ambulatory Visit: Payer: Self-pay

## 2020-10-21 ENCOUNTER — Emergency Department: Admission: EM | Admit: 2020-10-21 | Discharge: 2020-10-21 | Discharge status: Absent without leave | Payer: Self-pay

## 2020-10-21 VITALS — BP 138/93 | HR 66 | Temp 98.4°F | Resp 16 | Ht 64.0 in | Wt 203.0 lb

## 2020-10-21 DIAGNOSIS — R300 Dysuria: Secondary | ICD-10-CM | POA: Diagnosis not present

## 2020-10-21 DIAGNOSIS — N390 Urinary tract infection, site not specified: Secondary | ICD-10-CM | POA: Diagnosis not present

## 2020-10-21 LAB — POCT URINALYSIS DIP (MANUAL ENTRY)
Bilirubin, UA: NEGATIVE
Glucose, UA: NEGATIVE mg/dL
Ketones, POC UA: NEGATIVE mg/dL
Nitrite, UA: NEGATIVE
Protein Ur, POC: NEGATIVE mg/dL
Spec Grav, UA: 1.015 (ref 1.010–1.025)
Urobilinogen, UA: 0.2 E.U./dL
pH, UA: 6 (ref 5.0–8.0)

## 2020-10-21 MED ORDER — METRONIDAZOLE 0.75 % VA GEL: 1.0000 | Freq: Two times a day (BID) | VAGINAL | 0 refills | Status: DC

## 2020-10-21 MED ORDER — NITROFURANTOIN MONOHYD MACRO 100 MG PO CAPS: 100.0000 mg | ORAL_CAPSULE | Freq: Two times a day (BID) | ORAL | 0 refills | Status: DC

## 2020-10-21 NOTE — ED Provider Notes (Signed)
[facility]    CSN: [id number] Arrival date & time: 10/21/20  1857      History   Chief Complaint Chief Complaint  Patient presents with  . Dysuria  . Hematuria    HPI Alicia Solomon is a 50 y.o. female.   HPI   Patient states that she thinks she may have had BV.  She had classic odor.  She took the rest of her MetroGel.  She would like to have a refill of this.  She also has dysuria.  Frequency.  She noticed some blood in her urine this morning.  She feels like she has a bladder infection.  No abdominal pain.  No nausea or vomiting.  No fever.  No flank pain  Past Medical History:  Diagnosis Date  . Heart murmur   . Hypertension     Patient Active Problem List   Diagnosis Date Noted  . Dysuria 07/14/2016  . Unspecified essential hypertension 02/25/2013  . BMI 33.0-33.9,adult 02/25/2013    Past Surgical History:  Procedure Laterality Date  . ABDOMINAL HYSTERECTOMY  2008  . CESAREAN SECTION  1988  . CHOLECYSTECTOMY  1997  . ECTOPIC PREGNANCY SURGERY  1995    OB History   No obstetric history on file.      Home Medications    Prior to Admission medications   Medication Sig Start Date End Date Taking? Authorizing Provider  hydrochlorothiazide (HYDRODIURIL) 25 MG tablet Take 25 mg by mouth daily. 02/04/20  Yes [provider]  nitrofurantoin, macrocrystal-monohydrate, (MACROBID) 100 MG capsule Take 1 capsule (100 mg total) by mouth 2 (two) times daily. 10/21/20  Yes Eustace Moore, MD  benzonatate (TESSALON) 100 MG capsule Take 1 capsule (100 mg total) by mouth every 8 (eight) hours. 10/04/20   Lurene Shadow, PA-C  metroNIDAZOLE (METROGEL VAGINAL) 0.75 % vaginal gel Place 1 Applicatorful vaginally 2 (two) times daily. 10/21/20   Eustace Moore, MD    Family History Family History  Problem Relation Age of Onset  . Hypertension Mother   . Colon cancer Maternal Aunt   . Breast cancer Maternal Aunt   . Prostate cancer Maternal Uncle    . Cancer Maternal Uncle   . Hyperlipidemia Maternal Aunt     Social History Social History   Tobacco Use  . Smoking status: Never Smoker  . Smokeless tobacco: Never Used  Vaping Use  . Vaping Use: Never used  Substance Use Topics  . Alcohol use: Yes    Comment: occ  . Drug use: No     Allergies   Melatonin, Bupropion, and Duloxetine   Review of Systems Review of Systems See HPI  Physical Exam Triage Vital Signs ED Triage Vitals  Enc Vitals Group     BP 10/21/20 1913 (!) 138/93     Pulse Rate 10/21/20 1913 66     Resp 10/21/20 1913 16     Temp 10/21/20 1913 98.4 F (36.9 C)     Temp Source 10/21/20 1913 Oral     SpO2 10/21/20 1913 100 %     Weight 10/21/20 1909 203 lb (92.1 kg)     Height 10/21/20 1909 5\' 4"  (1.626 m)     Head Circumference --      Peak Flow --      Pain Score 10/21/20 1909 0     Pain Loc --      Pain Edu? --      Excl. in GC? --  No data found.  Updated Vital Signs BP (!) 138/93 (BP Location: Right Arm)   Pulse 66   Temp 98.4 F (36.9 C) (Oral)   Resp 16   Ht 5\' 4"  (1.626 m)   Wt 92.1 kg   SpO2 100%   BMI 34.84 kg/m      Physical Exam Constitutional:      General: She is not in acute distress.    Appearance: She is well-developed and well-nourished.  HENT:     Head: Normocephalic and atraumatic.     Mouth/Throat:     Mouth: Oropharynx is clear and moist.  Eyes:     Conjunctiva/sclera: Conjunctivae normal.     Pupils: Pupils are equal, round, and reactive to light.  Cardiovascular:     Rate and Rhythm: Normal rate.  Pulmonary:     Effort: Pulmonary effort is normal. No respiratory distress.  Abdominal:     Palpations: Abdomen is soft.     Tenderness: There is no right CVA tenderness or left CVA tenderness.  Musculoskeletal:        General: No edema. Normal range of motion.     Cervical back: Normal range of motion.  Skin:    General: Skin is warm and dry.  Neurological:     Mental Status: She is alert.   Psychiatric:        Behavior: Behavior normal.      UC Treatments / Results  Labs (all labs ordered are listed, but only abnormal results are displayed) Labs Reviewed  POCT URINALYSIS DIP (MANUAL ENTRY) - Abnormal; Notable for the following components:      Result Value   Clarity, UA cloudy (*)    Blood, UA moderate (*)    Leukocytes, UA Moderate (2+) (*)    All other components within normal limits  URINE CULTURE    EKG   Radiology No results found.  Procedures Procedures (including critical care time)  Medications Ordered in UC Medications - No data to display  Initial Impression / Assessment and Plan / UC Course  I have reviewed the triage vital signs and the nursing notes.  Pertinent labs & imaging results that were available during my care of the patient were reviewed by me and considered in my medical decision making (see chart for details).     *Urine culture submitted.  Will check against sensitivities when they are available For prevention of recurring BV we discussed a probiotic for 1 month Final Clinical Impressions(s) / UC Diagnoses   Final diagnoses:  Lower urinary tract infectious disease  Dysuria     Discharge Instructions     Drink lots of water Take the Macrobid 2 times a day.  This is an antibiotic for your bladder infection I have refilled your metronidazole gel in case it is needed Consider taking a probiotic for women daily   ED Prescriptions    Medication Sig Dispense Auth. Provider   metroNIDAZOLE (METROGEL VAGINAL) 0.75 % vaginal gel Place 1 Applicatorful vaginally 2 (two) times daily. 70 g , MD   nitrofurantoin, macrocrystal-monohydrate, (MACROBID) 100 MG capsule Take 1 capsule (100 mg total) by mouth 2 (two) times daily. 10 capsule Eustace Moore, MD     PDMP not reviewed this encounter.   Eustace Moore, MD 10/21/20 10/23/20

## 2020-10-21 NOTE — Discharge Instructions (Addendum)
Drink lots of water Take the Macrobid 2 times a day.  This is an antibiotic for your bladder infection I have refilled your metronidazole gel in case it is needed Consider taking a probiotic for women daily

## 2020-10-21 NOTE — ED Triage Notes (Signed)
Pt presents to Urgent Care with c/o dysuria x 4 days and noticed blood on toilet tissue when wiping today.

## 2020-10-23 LAB — URINE CULTURE
MICRO NUMBER:: 11550066
Result:: NO GROWTH
SPECIMEN QUALITY:: ADEQUATE

## 2020-11-01 ENCOUNTER — Other Ambulatory Visit (HOSPITAL_COMMUNITY)
Admission: RE | Admit: 2020-11-01 | Discharge: 2020-11-01 | Disposition: A | Discharge status: Home / Self Care | Payer: No Typology Code available for payment source | Source: Ambulatory Visit | Attending: Family Medicine | Admitting: Family Medicine

## 2020-11-01 ENCOUNTER — Emergency Department
Admission: RE | Admit: 2020-11-01 | Discharge: 2020-11-01 | Disposition: A | Discharge status: Home / Self Care | Payer: No Typology Code available for payment source | Source: Ambulatory Visit

## 2020-11-01 ENCOUNTER — Other Ambulatory Visit: Payer: Self-pay

## 2020-11-01 VITALS — BP 125/85 | HR 64 | Temp 97.9°F | Resp 16

## 2020-11-01 DIAGNOSIS — A5901 Trichomonal vulvovaginitis: Secondary | ICD-10-CM | POA: Insufficient documentation

## 2020-11-01 DIAGNOSIS — N760 Acute vaginitis: Secondary | ICD-10-CM

## 2020-11-01 DIAGNOSIS — R300 Dysuria: Secondary | ICD-10-CM | POA: Diagnosis not present

## 2020-11-01 LAB — POCT URINALYSIS DIP (MANUAL ENTRY)
Bilirubin, UA: NEGATIVE
Glucose, UA: NEGATIVE mg/dL
Ketones, POC UA: NEGATIVE mg/dL
Nitrite, UA: NEGATIVE
Protein Ur, POC: NEGATIVE mg/dL
Spec Grav, UA: 1.025 (ref 1.010–1.025)
Urobilinogen, UA: 0.2 E.U./dL
pH, UA: 7 (ref 5.0–8.0)

## 2020-11-01 MED ORDER — FLUCONAZOLE 150 MG PO TABS: ORAL_TABLET | ORAL | 0 refills | Status: DC

## 2020-11-01 NOTE — ED Provider Notes (Signed)
[facility]   CC: UTI  SUBJECTIVE:  Alicia Solomon is a 50 y.o. female who complains of urinary frequency, urgency and dysuria for the past few weeks. Was seen in this office and treated with Macrobid and metrogel x 11 days ago. Urine culture came back negative from that visit. Reports that she did take the Macrobid and that her symptoms did not really improve. She ended up not using the metrogel. Patient denies a precipitating event, recent sexual encounter, excessive caffeine intake. Pain is intermittent and describes it as burning. Has not tried OTC medications without relief. Symptoms are made worse with urination. Admits to similar symptoms in the past. Denies fever, chills, nausea, vomiting, abdominal pain, flank pain, abnormal vaginal discharge or bleeding.  LMP: No LMP recorded. Patient has had a hysterectomy.  ROS: As in HPI.  All other pertinent ROS negative.     Past Medical History:  Diagnosis Date  . Heart murmur   . Hypertension    Past Surgical History:  Procedure Laterality Date  . ABDOMINAL HYSTERECTOMY  2008  . CESAREAN SECTION  1988  . CHOLECYSTECTOMY  1997  . ECTOPIC PREGNANCY SURGERY  1995   Allergies  Allergen Reactions  . Melatonin Rash  . Bupropion     Other reaction(s): Other (See Comments) Felt wierd  . Duloxetine Anxiety    Insomnia    No current facility-administered medications on file prior to encounter.   Current Outpatient Medications on File Prior to Encounter  Medication Sig Dispense Refill  . benzonatate (TESSALON) 100 MG capsule Take 1 capsule (100 mg total) by mouth every 8 (eight) hours. 21 capsule 0  . hydrochlorothiazide (HYDRODIURIL) 25 MG tablet Take 25 mg by mouth daily.    . metroNIDAZOLE (METROGEL VAGINAL) 0.75 % vaginal gel Place 1 Applicatorful vaginally 2 (two) times daily. 70 g 0  . nitrofurantoin, macrocrystal-monohydrate, (MACROBID) 100 MG capsule Take 1 capsule (100 mg total) by mouth 2 (two) times daily. 10  capsule 0   Social History   Socioeconomic History  . Marital status: Married    Spouse name: Not on file  . Number of children: Not on file  . Years of education: Not on file  . Highest education level: Not on file  Occupational History  . Not on file  Tobacco Use  . Smoking status: Never Smoker  . Smokeless tobacco: Never Used  Vaping Use  . Vaping Use: Never used  Substance and Sexual Activity  . Alcohol use: Yes    Comment: occ  . Drug use: No  . Sexual activity: Not on file  Other Topics Concern  . Not on file  Social History Narrative  . Not on file   Social Determinants of Health   Financial Resource Strain: Not on file  Food Insecurity: Not on file  Transportation Needs: Not on file  Physical Activity: Not on file  Stress: Not on file  Social Connections: Not on file  Intimate Partner Violence: Not on file   Family History  Problem Relation Age of Onset  . Hypertension Mother   . Colon cancer Maternal Aunt   . Breast cancer Maternal Aunt   . Prostate cancer Maternal Uncle   . Cancer Maternal Uncle   . Hyperlipidemia Maternal Aunt     OBJECTIVE:  Vitals:   11/01/20 1310  BP: 125/85  Pulse: 64  Resp: 16  Temp: 97.9 F (36.6 C)  TempSrc: Oral  SpO2: 100%   General appearance: AOx3 in no  acute distress HEENT: NCAT. Oropharynx clear.  Lungs: clear to auscultation bilaterally without adventitious breath sounds Heart: regular rate and rhythm. Radial pulses 2+ symmetrical bilaterally Abdomen: soft; non-distended; no tenderness; bowel sounds present; no guarding or rebound tenderness GU: deferred Back: no CVA tenderness Extremities: no edema; symmetrical with no gross deformities Skin: warm and dry Neurologic: Ambulates from chair to exam table without difficulty Psychological: alert and cooperative; normal mood and affect  Labs Reviewed  POCT URINALYSIS DIP (MANUAL ENTRY) - Abnormal; Notable for the following components:      Result Value    Clarity, UA cloudy (*)    Blood, UA trace-intact (*)    Leukocytes, UA Small (1+) (*)    All other components within normal limits  URINE CULTURE  CERVICOVAGINAL ANCILLARY ONLY    ASSESSMENT & PLAN:  1. Acute vaginitis   2. Dysuria     Meds ordered this encounter  Medications  . fluconazole (DIFLUCAN) 150 MG tablet    Sig: Take one tablet at the onset of symptoms. If symptoms are still present 3 days later, take the second tablet.    Dispense:  2 tablet    Refill:  0    Order Specific Question:   Supervising Provider    Answer:   Merrilee Jansky [[id number]]   Prescribed fluconazole in case of yeast Has metrogel at home in case of BV  Cytology swab obtained in office today Urine culture sent  We will call you with abnormal results that need further treatment Push fluids and get plenty of rest  Follow up with PCP if symptoms persists Return here or go to ER if you have any new or worsening symptoms such as fever, worsening abdominal pain, nausea/vomiting, flank pain  Outlined signs and symptoms indicating need for more acute intervention Patient verbalized understanding After Visit Summary given     Moshe Cipro, NP 11/01/20 1347

## 2020-11-01 NOTE — ED Triage Notes (Signed)
Patient presents to Urgent Care with complaints of continued burning with urination since a few weeks ago. Patient reports she was seen and treated here 11 days ago and the burning has not resolved. Pt notices sometimes at the end of her urine stream she will see a little bit of blood, did notice some blood this morning during urination.

## 2020-11-01 NOTE — Discharge Instructions (Signed)
Urine does not look remarkable for infection today. We will send for culture to be sure.  Swab tests will take about 2 days to result. We will be in touch with any abnormal results that require further treatment.  I have sent in fluconazole in case of yeast. Take one tablet at the onset of symptoms. If symptoms are still present in 3 days, take the second tablet.   Follow up with this office or with primary care if symptoms are persisting.  Follow up in the ER for high fever, trouble swallowing, trouble breathing, other concerning symptoms.

## 2020-11-02 LAB — CERVICOVAGINAL ANCILLARY ONLY
Bacterial Vaginitis (gardnerella): NEGATIVE
Candida Glabrata: NEGATIVE
Candida Vaginitis: NEGATIVE
Chlamydia: NEGATIVE
Comment: NEGATIVE
Comment: NEGATIVE
Comment: NEGATIVE
Comment: NEGATIVE
Comment: NEGATIVE
Comment: NORMAL
Neisseria Gonorrhea: NEGATIVE
Trichomonas: POSITIVE — AB

## 2020-11-03 ENCOUNTER — Telehealth: Payer: Self-pay | Admitting: Family Medicine

## 2020-11-03 ENCOUNTER — Telehealth: Payer: Self-pay

## 2020-11-03 LAB — URINE CULTURE
MICRO NUMBER:: 11590073
SPECIMEN QUALITY:: ADEQUATE

## 2020-11-03 MED ORDER — METRONIDAZOLE 500 MG PO TABS: 500.0000 mg | ORAL_TABLET | Freq: Two times a day (BID) | ORAL | 0 refills | Status: DC

## 2020-11-03 NOTE — Telephone Encounter (Signed)
Vaginal swab was positive for trichomonas.  I am calling metronidazole into her pharmacy

## 2020-11-03 NOTE — Telephone Encounter (Signed)
Pt called needing tx for trichomonas. Saw results in MyChart. Dr Delton See notified and sent in oral flagyl to pts preferred pharmacy. Pt notified rx has been sent.

## 2021-11-16 DIAGNOSIS — R200 Anesthesia of skin: Secondary | ICD-10-CM | POA: Insufficient documentation

## 2021-11-16 DIAGNOSIS — M21371 Foot drop, right foot: Secondary | ICD-10-CM | POA: Insufficient documentation

## 2022-03-20 DIAGNOSIS — R946 Abnormal results of thyroid function studies: Secondary | ICD-10-CM | POA: Insufficient documentation

## 2022-03-20 DIAGNOSIS — Z0000 Encounter for general adult medical examination without abnormal findings: Secondary | ICD-10-CM | POA: Insufficient documentation

## 2022-04-27 ENCOUNTER — Ambulatory Visit
Admission: RE | Admit: 2022-04-27 | Discharge: 2022-04-27 | Discharge status: Absent without leave | Payer: Self-pay | Source: Ambulatory Visit

## 2022-04-27 ENCOUNTER — Other Ambulatory Visit: Payer: Self-pay

## 2022-04-27 ENCOUNTER — Encounter: Payer: Self-pay | Admitting: Emergency Medicine

## 2022-04-27 ENCOUNTER — Ambulatory Visit
Admission: EM | Admit: 2022-04-27 | Discharge: 2022-04-27 | Disposition: A | Discharge status: Home / Self Care | Payer: No Typology Code available for payment source | Attending: Family Medicine | Admitting: Family Medicine

## 2022-04-27 DIAGNOSIS — R300 Dysuria: Secondary | ICD-10-CM

## 2022-04-27 DIAGNOSIS — Z113 Encounter for screening for infections with a predominantly sexual mode of transmission: Secondary | ICD-10-CM | POA: Diagnosis not present

## 2022-04-27 DIAGNOSIS — N3090 Cystitis, unspecified without hematuria: Secondary | ICD-10-CM | POA: Diagnosis present

## 2022-04-27 LAB — POCT URINALYSIS DIP (MANUAL ENTRY)
Bilirubin, UA: NEGATIVE
Glucose, UA: NEGATIVE mg/dL
Ketones, POC UA: NEGATIVE mg/dL
Nitrite, UA: NEGATIVE
Protein Ur, POC: NEGATIVE mg/dL
Spec Grav, UA: 1.015 (ref 1.010–1.025)
Urobilinogen, UA: 0.2 E.U./dL
pH, UA: 7 (ref 5.0–8.0)

## 2022-04-27 MED ORDER — NITROFURANTOIN MONOHYD MACRO 100 MG PO CAPS: 100.0000 mg | ORAL_CAPSULE | Freq: Two times a day (BID) | ORAL | 0 refills | Status: DC

## 2022-04-27 NOTE — ED Provider Notes (Signed)
[facility]    CSN: [id number] Arrival date & time: 04/27/22  4854      History   Chief Complaint Chief Complaint  Patient presents with   Dysuria    HPI Alicia Solomon is a 51 y.o. female.    Patient is here for dysuria and burning.  Started yesterday.  She like to be checked for urinary tract infection.  She has been treated here for cystitis previously. She also fears sexually-transmitted diseases and would like testing.  No vaginitis symptoms or abdominal pain HPI  Past Medical History:  Diagnosis Date   Heart murmur    Hypertension     Patient Active Problem List   Diagnosis Date Noted   Dysuria 07/14/2016   Unspecified essential hypertension 02/25/2013   BMI 33.0-33.9,adult 02/25/2013    Past Surgical History:  Procedure Laterality Date   ABDOMINAL HYSTERECTOMY  2008   CESAREAN SECTION  1988   CHOLECYSTECTOMY  1997   ECTOPIC PREGNANCY SURGERY  1995    OB History   No obstetric history on file.      Home Medications    Prior to Admission medications   Medication Sig Start Date End Date Taking? Authorizing Provider  Multiple Minerals-Vitamins (CALCIUM & VIT D3 BONE HEALTH PO) Take by mouth.   Yes [provider]  nitrofurantoin, macrocrystal-monohydrate, (MACROBID) 100 MG capsule Take 1 capsule (100 mg total) by mouth 2 (two) times daily. 04/27/22  Yes Eustace Moore, MD  hydrochlorothiazide (HYDRODIURIL) 25 MG tablet Take 25 mg by mouth daily. 02/04/20   [provider]    Family History Family History  Problem Relation Age of Onset   Hypertension Mother    Colon cancer Maternal Aunt    Breast cancer Maternal Aunt    Prostate cancer Maternal Uncle    Cancer Maternal Uncle    Hyperlipidemia Maternal Aunt     Social History Social History   Tobacco Use   Smoking status: Never   Smokeless tobacco: Never  Vaping Use   Vaping Use: Never used  Substance Use Topics   Alcohol use: Yes    Comment: occ   Drug  use: No     Allergies   Melatonin, Bupropion, and Duloxetine   Review of Systems Review of Systems  See HPI Physical Exam Triage Vital Signs ED Triage Vitals  Enc Vitals Group     BP 04/27/22 1005 137/85     Pulse Rate 04/27/22 1005 61     Resp 04/27/22 1005 16     Temp 04/27/22 1005 98.2 F (36.8 C)     Temp Source 04/27/22 1005 Oral     SpO2 04/27/22 1005 100 %     Weight 04/27/22 1006 142 lb (64.4 kg)     Height 04/27/22 1006 5\' 4"  (1.626 m)     Head Circumference --      Peak Flow --      Pain Score 04/27/22 1006 9     Pain Loc --      Pain Edu? --      Excl. in GC? --    No data found.  Updated Vital Signs BP 137/85 (BP Location: Left Arm)   Pulse 61   Temp 98.2 F (36.8 C) (Oral)   Resp 16   Ht 5\' 4"  (1.626 m)   Wt 64.4 kg   SpO2 100%   BMI 24.37 kg/m      Physical Exam Constitutional:      General: She  is not in acute distress.    Appearance: She is well-developed.  HENT:     Head: Normocephalic and atraumatic.  Eyes:     Conjunctiva/sclera: Conjunctivae normal.     Pupils: Pupils are equal, round, and reactive to light.  Cardiovascular:     Rate and Rhythm: Normal rate.  Pulmonary:     Effort: Pulmonary effort is normal. No respiratory distress.  Abdominal:     General: There is no distension.     Palpations: Abdomen is soft.     Tenderness: There is no right CVA tenderness or left CVA tenderness.  Musculoskeletal:        General: Normal range of motion.     Cervical back: Normal range of motion.  Skin:    General: Skin is warm and dry.  Neurological:     Mental Status: She is alert.  Psychiatric:        Mood and Affect: Mood normal.        Behavior: Behavior normal.      UC Treatments / Results  Labs (all labs ordered are listed, but only abnormal results are displayed) Labs Reviewed  POCT URINALYSIS DIP (MANUAL ENTRY) - Abnormal; Notable for the following components:      Result Value   Color, UA light yellow (*)     Clarity, UA cloudy (*)    Blood, UA moderate (*)    Leukocytes, UA Small (1+) (*)    All other components within normal limits  URINE CULTURE  CERVICOVAGINAL ANCILLARY ONLY    EKG   Radiology No results found.  Procedures Procedures (including critical care time)  Medications Ordered in UC Medications - No data to display  Initial Impression / Assessment and Plan / UC Course  I have reviewed the triage vital signs and the nursing notes.  Pertinent labs & imaging results that were available during my care of the patient were reviewed by me and considered in my medical decision making (see chart for details).     Final Clinical Impressions(s) / UC Diagnoses   Final diagnoses:  Dysuria  Cystitis  Screening for STDs (sexually transmitted diseases)     Discharge Instructions      Drink lots of fluids Macrobid 2 times a day for 5 days  Check MyChart for test results   ED Prescriptions     Medication Sig Dispense Auth. Provider   nitrofurantoin, macrocrystal-monohydrate, (MACROBID) 100 MG capsule Take 1 capsule (100 mg total) by mouth 2 (two) times daily. 10 capsule Eustace Moore, MD      PDMP not reviewed this encounter.   Eustace Moore, MD 04/27/22 1100

## 2022-04-27 NOTE — ED Triage Notes (Signed)
Dysuria x 2 days  Possible STD

## 2022-04-27 NOTE — Discharge Instructions (Signed)
Drink lots of fluids Macrobid 2 times a day for 5 days  Check MyChart for test results

## 2022-04-28 LAB — CERVICOVAGINAL ANCILLARY ONLY
Bacterial Vaginitis (gardnerella): POSITIVE — AB
Candida Glabrata: NEGATIVE
Candida Vaginitis: NEGATIVE
Chlamydia: NEGATIVE
Comment: NEGATIVE
Comment: NEGATIVE
Comment: NEGATIVE
Comment: NEGATIVE
Comment: NEGATIVE
Comment: NORMAL
Neisseria Gonorrhea: NEGATIVE
Trichomonas: NEGATIVE

## 2022-04-29 LAB — URINE CULTURE: Culture: 60000 — AB

## 2022-05-01 ENCOUNTER — Telehealth (HOSPITAL_COMMUNITY): Payer: Self-pay | Admitting: Emergency Medicine

## 2022-05-01 MED ORDER — METRONIDAZOLE 500 MG PO TABS: 500.0000 mg | ORAL_TABLET | Freq: Two times a day (BID) | ORAL | 0 refills | Status: DC

## 2022-07-31 ENCOUNTER — Ambulatory Visit
Admission: RE | Admit: 2022-07-31 | Discharge: 2022-07-31 | Discharge status: Against Medical Advice | Payer: No Typology Code available for payment source | Source: Ambulatory Visit | Attending: Internal Medicine | Admitting: Internal Medicine

## 2022-08-01 ENCOUNTER — Encounter: Payer: Self-pay | Admitting: Emergency Medicine

## 2022-08-01 ENCOUNTER — Ambulatory Visit
Admission: EM | Admit: 2022-08-01 | Discharge: 2022-08-01 | Disposition: A | Discharge status: Home / Self Care | Payer: No Typology Code available for payment source | Attending: Family Medicine | Admitting: Family Medicine

## 2022-08-01 DIAGNOSIS — I10 Essential (primary) hypertension: Secondary | ICD-10-CM | POA: Diagnosis not present

## 2022-08-01 DIAGNOSIS — R300 Dysuria: Secondary | ICD-10-CM | POA: Diagnosis present

## 2022-08-01 DIAGNOSIS — Z87440 Personal history of urinary (tract) infections: Secondary | ICD-10-CM | POA: Diagnosis not present

## 2022-08-01 DIAGNOSIS — N3001 Acute cystitis with hematuria: Secondary | ICD-10-CM | POA: Insufficient documentation

## 2022-08-01 LAB — POCT URINALYSIS DIP (MANUAL ENTRY)
Bilirubin, UA: NEGATIVE
Glucose, UA: NEGATIVE mg/dL
Ketones, POC UA: NEGATIVE mg/dL
Nitrite, UA: NEGATIVE
Protein Ur, POC: 100 mg/dL — AB
Spec Grav, UA: >=1.03 — AB (ref 1.010–1.025)
Urobilinogen, UA: 0.2 E.U./dL
pH, UA: 7 (ref 5.0–8.0)

## 2022-08-01 MED ORDER — NITROFURANTOIN MONOHYD MACRO 100 MG PO CAPS: 100.0000 mg | ORAL_CAPSULE | Freq: Two times a day (BID) | ORAL | 0 refills | Status: AC

## 2022-08-01 NOTE — ED Triage Notes (Signed)
Dysuria x 1 day  Also requested an aptima swab for BV/yeast

## 2022-08-01 NOTE — ED Provider Notes (Signed)
[facility]    CSN: [id number] Arrival date & time: 08/01/22  1201      History   Chief Complaint Chief Complaint  Patient presents with   Dysuria    HPI Alicia Solomon is a 51 y.o. female.   HPI Very pleasant 51 year old female presents with dysuria for 1 day.  Patient also requests Aptima swab for possible bacterial vaginosis and vaginal yeast infection.  PMH significant for HTN and recurrent UTI.  Past Medical History:  Diagnosis Date   Heart murmur    Hypertension     Patient Active Problem List   Diagnosis Date Noted   Dysuria 07/14/2016   Unspecified essential hypertension 02/25/2013   BMI 33.0-33.9,adult 02/25/2013    Past Surgical History:  Procedure Laterality Date   ABDOMINAL HYSTERECTOMY  2008   CESAREAN SECTION  1988   CHOLECYSTECTOMY  1997   ECTOPIC PREGNANCY SURGERY  1995    OB History   No obstetric history on file.      Home Medications    Prior to Admission medications   Medication Sig Start Date End Date Taking? Authorizing Provider  nitrofurantoin, macrocrystal-monohydrate, (MACROBID) 100 MG capsule Take 1 capsule (100 mg total) by mouth 2 (two) times daily for 7 days. 08/01/22 08/08/22 Yes Trevor Iha, FNP  Vitamin D, Ergocalciferol, (DRISDOL) 1.25 MG (50000 UNIT) CAPS capsule Take 50,000 Units by mouth once a week. 06/12/22  Yes [provider]  hydrochlorothiazide (HYDRODIURIL) 25 MG tablet Take 25 mg by mouth daily. 02/04/20   [provider]  Multiple Minerals-Vitamins (CALCIUM & VIT D3 BONE HEALTH PO) Take by mouth. Patient not taking: Reported on 08/01/2022    [provider]    Family History Family History  Problem Relation Age of Onset   Hypertension Mother    Colon cancer Maternal Aunt    Breast cancer Maternal Aunt    Prostate cancer Maternal Uncle    Cancer Maternal Uncle    Hyperlipidemia Maternal Aunt     Social History Social History   Tobacco Use   Smoking status: Never    Smokeless tobacco: Never  Vaping Use   Vaping Use: Never used  Substance Use Topics   Alcohol use: Yes    Comment: occ   Drug use: No     Allergies   Melatonin, Bupropion, and Duloxetine   Review of Systems Review of Systems  Genitourinary:  Positive for dysuria.  All other systems reviewed and are negative.    Physical Exam Triage Vital Signs ED Triage Vitals  Enc Vitals Group     BP 08/01/22 1249 103/70     Pulse Rate 08/01/22 1249 62     Resp 08/01/22 1249 14     Temp 08/01/22 1249 98.6 F (37 C)     Temp Source 08/01/22 1249 Oral     SpO2 08/01/22 1249 100 %     Weight 08/01/22 1251 143 lb (64.9 kg)     Height 08/01/22 1251 5\' 4"  (1.626 m)     Head Circumference --      Peak Flow --      Pain Score 08/01/22 1250 4     Pain Loc --      Pain Edu? --      Excl. in GC? --    No data found.  Updated Vital Signs BP 103/70 (BP Location: Right Arm)   Pulse 62   Temp 98.6 F (37 C) (Oral)   Resp 14   Ht  5\' 4"  (1.626 m)   Wt 143 lb (64.9 kg)   SpO2 100%   BMI 24.55 kg/m   Physical Exam Vitals and nursing note reviewed.  Constitutional:      General: She is not in acute distress.    Appearance: Normal appearance. She is normal weight. She is not ill-appearing.  HENT:     Head: Normocephalic and atraumatic.     Mouth/Throat:     Mouth: Mucous membranes are moist.     Pharynx: Oropharynx is clear.  Eyes:     Extraocular Movements: Extraocular movements intact.     Conjunctiva/sclera: Conjunctivae normal.     Pupils: Pupils are equal, round, and reactive to light.  Cardiovascular:     Rate and Rhythm: Normal rate and regular rhythm.     Pulses: Normal pulses.     Heart sounds: Normal heart sounds. No murmur heard. Pulmonary:     Effort: Pulmonary effort is normal.     Breath sounds: Normal breath sounds. No wheezing, rhonchi or rales.  Abdominal:     Tenderness: There is no right CVA tenderness or left CVA tenderness.  Musculoskeletal:         General: Normal range of motion.     Cervical back: Normal range of motion and neck supple.  Skin:    General: Skin is warm and dry.  Neurological:     General: No focal deficit present.     Mental Status: She is alert and oriented to person, place, and time. Mental status is at baseline.      UC Treatments / Results  Labs (all labs ordered are listed, but only abnormal results are displayed) Labs Reviewed  POCT URINALYSIS DIP (MANUAL ENTRY) - Abnormal; Notable for the following components:      Result Value   Color, UA other (*)    Clarity, UA turbid (*)    Spec Grav, UA >=1.030 (*)    Blood, UA large (*)    Protein Ur, POC =100 (*)    Leukocytes, UA Large (3+) (*)    All other components within normal limits  URINE CULTURE  CERVICOVAGINAL ANCILLARY ONLY    EKG   Radiology No results found.  Procedures Procedures (including critical care time)  Medications Ordered in UC Medications - No data to display  Initial Impression / Assessment and Plan / UC Course  I have reviewed the triage vital signs and the nursing notes.  Pertinent labs & imaging results that were available during my care of the patient were reviewed by me and considered in my medical decision making (see chart for details).     1.  Acute cystitis with hematuria-Rx'd Macrobid. Advised patient to take medication as directed with food to completion.  Encouraged patient increase daily water intake to 64 ounces while taking this medication.  Advised we will follow-up with Aptima swab results once received.  Patient discharged home, hemodynamically stable. Final Clinical Impressions(s) / UC Diagnoses   Final diagnoses:  Acute cystitis with hematuria     Discharge Instructions      Advised patient to take medication as directed with food to completion.  Encouraged patient increase daily water intake to 64 ounces while taking this medication.  Advised we will follow-up with Aptima swab results once  received.     ED Prescriptions     Medication Sig Dispense Auth. Provider   nitrofurantoin, macrocrystal-monohydrate, (MACROBID) 100 MG capsule Take 1 capsule (100 mg total) by mouth 2 (two) times daily  for 7 days. 14 capsule Trevor Iha, FNP      PDMP not reviewed this encounter.   Trevor Iha, FNP 08/01/22 1343

## 2022-08-01 NOTE — Discharge Instructions (Addendum)
Advised patient to take medication as directed with food to completion.  Encouraged patient increase daily water intake to 64 ounces while taking this medication.  Advised we will follow-up with Aptima swab results once received.

## 2022-08-02 LAB — CERVICOVAGINAL ANCILLARY ONLY
Bacterial Vaginitis (gardnerella): POSITIVE — AB
Candida Glabrata: NEGATIVE
Candida Vaginitis: NEGATIVE
Comment: NEGATIVE
Comment: NEGATIVE
Comment: NEGATIVE

## 2022-08-03 ENCOUNTER — Telehealth (HOSPITAL_COMMUNITY): Payer: Self-pay | Admitting: Emergency Medicine

## 2022-08-03 MED ORDER — METRONIDAZOLE 0.75 % VA GEL: 1.0000 | Freq: Every day | VAGINAL | 0 refills | Status: AC

## 2022-08-04 LAB — URINE CULTURE: Culture: 80000 — AB

## 2023-02-24 DIAGNOSIS — F320 Major depressive disorder, single episode, mild: Secondary | ICD-10-CM | POA: Insufficient documentation

## 2023-10-23 DIAGNOSIS — F410 Panic disorder [episodic paroxysmal anxiety] without agoraphobia: Secondary | ICD-10-CM | POA: Insufficient documentation

## 2023-12-30 ENCOUNTER — Ambulatory Visit
Admission: EM | Admit: 2023-12-30 | Discharge: 2023-12-30 | Disposition: A | Discharge status: Home / Self Care | Attending: Physician Assistant | Admitting: Physician Assistant

## 2023-12-30 DIAGNOSIS — N760 Acute vaginitis: Secondary | ICD-10-CM | POA: Diagnosis not present

## 2023-12-30 LAB — POCT URINALYSIS DIP (MANUAL ENTRY)
Bilirubin, UA: NEGATIVE
Blood, UA: NEGATIVE
Glucose, UA: NEGATIVE mg/dL
Leukocytes, UA: NEGATIVE
Nitrite, UA: NEGATIVE
Spec Grav, UA: 1.025 (ref 1.010–1.025)
Urobilinogen, UA: 1 U/dL
pH, UA: 6.5 (ref 5.0–8.0)

## 2023-12-30 NOTE — ED Triage Notes (Signed)
"  It has been a few wks, I some irritation when I pee and a bad vaginal odor with some discharge". "I think it is either a UTI or BV". No concern for STI. No fever.

## 2023-12-30 NOTE — ED Provider Notes (Signed)
 [facility]    CSN: [id number] Arrival date & time: 12/30/23  1242      History   Chief Complaint Chief Complaint  Patient presents with   UTI Symptoms    Vaginal Problem    HPI Alicia Solomon is a 53 y.o. female.   Patient here today for evaluation of mild irritation with urination as well as bad vaginal odor and discharge.  She reports she thinks she might have a UTI or BV.  She does have recurrent BV.  She denies any concerns for STDs.  She has not had any fever.  She denies any abdominal pain or back pain.  The history is provided by the patient.    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   Abnormal thyroid  function test 03/20/2022   Routine physical examination 03/20/2022   Numbness and tingling in both hands 11/16/2021   Numbness and tingling of both legs below knees 11/16/2021   Right foot drop 11/16/2021   History of hysterectomy 11/19/2019   Current moderate episode of major depressive disorder without prior episode (HCC) 02/11/2019   Class 1 obesity due to excess calories without serious comorbidity with body mass index (BMI) of 34.0 to 34.9 in adult 12/26/2018   Generalized anxiety disorder 12/26/2018   Insomnia 12/26/2018   Bacterial vaginosis 10/05/2017   Dysuria 07/14/2016   Essential hypertension 02/25/2013   BMI 33.0-33.9,adult 02/25/2013   Chest tightness 06/19/2012   Family history of myocardial infarction 06/19/2012   Lightheaded 06/19/2012   SOB (shortness of breath) 06/19/2012   Palpitations 06/19/2012    Past Surgical History:  Procedure Laterality Date   ABDOMINAL HYSTERECTOMY  2008   CESAREAN SECTION  1988   CHOLECYSTECTOMY  1997   ECTOPIC PREGNANCY SURGERY  1995    OB History   No obstetric history on file.       Home Medications    Prior to Admission medications   Medication Sig Start Date End Date Taking? Authorizing Provider  gabapentin (NEURONTIN) 100 MG capsule Take 100 mg by mouth at bedtime. 11/23/23  Yes [provider]  hydrochlorothiazide (HYDRODIURIL) 25 MG tablet Take 25 mg by mouth daily. 02/04/20  Yes [provider]  hydrOXYzine (ATARAX) 10 MG tablet Take 5-10 mg by mouth every 6 (six) hours as needed. 10/23/23  Yes [provider]  ibuprofen  (ADVIL ) 800 MG tablet Take 800 mg by mouth every 8 (eight) hours as needed. 02/25/17  Yes [provider]  meloxicam (MOBIC) 15 MG tablet Take 1 tablet by mouth daily. 11/29/23  Yes [provider]  methocarbamol  (ROBAXIN ) 750 MG tablet Take 750 mg by mouth 4 (four) times daily. 02/25/17  Yes [provider]  Multiple Minerals-Vitamins (CALCIUM & VIT D3 BONE HEALTH PO) Take by mouth.   Yes [provider]  PERMETHRIN EX ASO - [id number] 02/26/17  Yes [provider]  aspirin-acetaminophen-caffeine (EXCEDRIN MIGRAINE) 250-250-65 MG tablet Take 1 tablet by mouth daily.    [provider]    Family History Family History  Problem Relation Age of Onset   Hypertension Mother    Colon cancer Maternal Aunt    Breast cancer Maternal Aunt    Prostate cancer Maternal Uncle    Cancer Maternal Uncle  Hyperlipidemia Maternal Aunt     Social History Social History   Tobacco Use   Smoking status: Never   Smokeless tobacco: Never  Vaping Use   Vaping status: Never Used  Substance Use Topics   Alcohol use: Yes    Comment: Occassionally.   Drug use: No     Allergies   Duloxetine, Melatonin, and Bupropion   Review of Systems Review of Systems  Constitutional:  Negative for chills and fever.  Eyes:  Negative for discharge and redness.  Respiratory:  Negative for shortness of breath.   Gastrointestinal:  Negative for abdominal pain, nausea and vomiting.   Genitourinary:  Positive for dysuria and vaginal discharge.     Physical Exam Triage Vital Signs ED Triage Vitals  Encounter Vitals Group     BP      Systolic BP Percentile      Diastolic BP Percentile      Pulse      Resp      Temp      Temp src      SpO2      Weight      Height      Head Circumference      Peak Flow      Pain Score      Pain Loc      Pain Education      Exclude from Growth Chart    No data found.  Updated Vital Signs BP 93/60 (BP Location: Left Arm)   Pulse 73   Temp 98.2 F (36.8 C) (Oral)   Resp 18   Ht 5\' 4"  (1.626 m)   Wt 147 lb (66.7 kg)   SpO2 98%   BMI 25.23 kg/m   Visual Acuity Right Eye Distance:   Left Eye Distance:   Bilateral Distance:    Right Eye Near:   Left Eye Near:    Bilateral Near:     Physical Exam Vitals and nursing note reviewed.  Constitutional:      General: She is not in acute distress.    Appearance: Normal appearance. She is not ill-appearing.  HENT:     Head: Normocephalic and atraumatic.  Eyes:     Conjunctiva/sclera: Conjunctivae normal.  Cardiovascular:     Rate and Rhythm: Normal rate.  Pulmonary:     Effort: Pulmonary effort is normal. No respiratory distress.  Neurological:     Mental Status: She is alert.  Psychiatric:        Mood and Affect: Mood normal.        Behavior: Behavior normal.        Thought Content: Thought content normal.      UC Treatments / Results  Labs (all labs ordered are listed, but only abnormal results are displayed) Labs Reviewed  POCT URINALYSIS DIP (MANUAL ENTRY) - Abnormal; Notable for the following components:      Result Value   Color, UA light yellow (*)    Clarity, UA cloudy (*)    Ketones, POC UA trace (5) (*)    Protein Ur, POC trace (*)    All other components within normal limits  URINE CULTURE  CERVICOVAGINAL ANCILLARY ONLY    EKG   Radiology No results found.  Procedures Procedures (including critical care time)  Medications  Ordered in UC Medications - No data to display  Initial Impression / Assessment and Plan / UC Course  I have reviewed the triage vital signs and the nursing notes.  Pertinent labs &  imaging results that were available during my care of the patient were reviewed by me and considered in my medical decision making (see chart for details).    UA without sign of UTI.  Will order screening for BV and yeast.  Advised we will await result for further recommendation.  Encouraged follow-up sooner with any concerns.  Final Clinical Impressions(s) / UC Diagnoses   Final diagnoses:  Acute vaginitis   Discharge Instructions   None    ED Prescriptions   None    PDMP not reviewed this encounter.   Vernestine Gondola, PA-C 12/30/23 1402

## 2023-12-31 LAB — CERVICOVAGINAL ANCILLARY ONLY
Bacterial Vaginitis (gardnerella): POSITIVE — AB
Candida Glabrata: NEGATIVE
Candida Vaginitis: NEGATIVE
Comment: NEGATIVE
Comment: NEGATIVE
Comment: NEGATIVE

## 2023-12-31 LAB — URINE CULTURE: Culture: NO GROWTH

## 2024-01-01 ENCOUNTER — Telehealth (HOSPITAL_COMMUNITY): Payer: Self-pay

## 2024-01-01 MED ORDER — METRONIDAZOLE 500 MG PO TABS: 500.0000 mg | ORAL_TABLET | Freq: Two times a day (BID) | ORAL | 0 refills | Status: AC

## 2024-01-01 MED ORDER — METRONIDAZOLE 0.75 % VA GEL: 1.0000 | Freq: Every day | VAGINAL | 0 refills | Status: AC

## 2024-01-01 NOTE — Telephone Encounter (Signed)
 Pt requested gel

## 2024-01-01 NOTE — Telephone Encounter (Signed)
 Per protocol, pt requires tx with metronidazole. Rx sent to pharmacy on file.

## 2024-05-28 ENCOUNTER — Ambulatory Visit
Admission: RE | Admit: 2024-05-28 | Discharge: 2024-05-28 | Disposition: A | Discharge status: Home / Self Care | Attending: Internal Medicine | Admitting: Internal Medicine

## 2024-05-28 VITALS — BP 122/85 | HR 71 | Temp 98.0°F | Resp 19

## 2024-05-28 DIAGNOSIS — N3001 Acute cystitis with hematuria: Secondary | ICD-10-CM | POA: Diagnosis not present

## 2024-05-28 DIAGNOSIS — N898 Other specified noninflammatory disorders of vagina: Secondary | ICD-10-CM | POA: Diagnosis not present

## 2024-05-28 LAB — POCT URINALYSIS DIP (MANUAL ENTRY)
Bilirubin, UA: NEGATIVE
Glucose, UA: NEGATIVE mg/dL
Ketones, POC UA: NEGATIVE mg/dL
Nitrite, UA: POSITIVE — AB
Protein Ur, POC: 100 mg/dL — AB
Spec Grav, UA: 1.025 (ref 1.010–1.025)
Urobilinogen, UA: 1 U/dL
pH, UA: 6.5 (ref 5.0–8.0)

## 2024-05-28 MED ORDER — CEFDINIR 300 MG PO CAPS: 300.0000 mg | ORAL_CAPSULE | Freq: Two times a day (BID) | ORAL | 0 refills | Status: AC

## 2024-05-28 NOTE — Discharge Instructions (Addendum)
 Advised patient to take medication as directed with food to completion.  Encouraged to increase daily water intake to 64 ounces per day while taking this medication.  Advised we will follow-up with urine culture and Aptima swab results once received.  Advised if symptoms worsen and/or unresolved please follow-up with your PCP, [facility], or here for further evaluation.

## 2024-05-28 NOTE — ED Provider Notes (Signed)
 [facility]    CSN: [id number] Arrival date & time: 05/28/24  9171      History   Chief Complaint Chief Complaint  Patient presents with   Dysuria    HPI Alicia Solomon is a 53 y.o. female.   HPI  Past Medical History:  Diagnosis Date   Heart murmur    Hypertension     Patient Active Problem List   Diagnosis Date Noted   Panic disorder (episodic paroxysmal anxiety) 10/23/2023   Major depressive disorder, single episode, mild 02/24/2023   Abnormal thyroid  function test 03/20/2022   Routine physical examination 03/20/2022   Numbness and tingling in both hands 11/16/2021   Numbness and tingling of both legs below knees 11/16/2021   Right foot drop 11/16/2021   History of hysterectomy 11/19/2019   Current moderate episode of major depressive disorder without prior episode (HCC) 02/11/2019   Class 1 obesity due to excess calories without serious comorbidity with body mass index (BMI) of 34.0 to 34.9 in adult 12/26/2018   Generalized anxiety disorder 12/26/2018   Insomnia 12/26/2018   Bacterial vaginosis 10/05/2017   Dysuria 07/14/2016   Essential hypertension 02/25/2013   BMI 33.0-33.9,adult 02/25/2013   Chest tightness 06/19/2012   Family history of myocardial infarction 06/19/2012   Lightheaded 06/19/2012   SOB (shortness of breath) 06/19/2012   Palpitations 06/19/2012    Past Surgical History:  Procedure Laterality Date   ABDOMINAL HYSTERECTOMY  2008   CESAREAN SECTION  1988   CHOLECYSTECTOMY  1997   ECTOPIC PREGNANCY SURGERY  1995    OB History   No obstetric history on file.      Home Medications    Prior to Admission medications   Medication Sig Start Date End Date Taking? Authorizing Provider  cefdinir  (OMNICEF ) 300 MG capsule Take 1 capsule (300 mg total) by mouth 2 (two) times daily for 7 days. 05/28/24 06/04/24 Yes Teddy Sharper, FNP  lisdexamfetamine (VYVANSE) 30 MG capsule Take 30 mg by mouth daily. 05/14/24  Yes [provider]  aspirin-acetaminophen-caffeine (EXCEDRIN MIGRAINE) 250-250-65 MG tablet Take 1 tablet by mouth daily.    [provider]  gabapentin (NEURONTIN) 100 MG capsule Take 100 mg by mouth at bedtime. 11/23/23   [provider]  hydrochlorothiazide (HYDRODIURIL) 25 MG tablet Take 25 mg by mouth daily. 02/04/20   [provider]  hydrOXYzine (ATARAX) 10 MG tablet Take 5-10 mg by mouth every 6 (six) hours as needed. 10/23/23   [provider]  ibuprofen  (ADVIL ) 800 MG tablet Take 800 mg by mouth every 8 (eight) hours as needed. 02/25/17   [provider]  meloxicam (MOBIC) 15 MG tablet Take 1 tablet by mouth daily. 11/29/23   [provider]  methocarbamol  (ROBAXIN ) 750 MG tablet Take 750 mg by mouth 4 (four) times daily. 02/25/17   [provider]  Multiple Minerals-Vitamins (CALCIUM & VIT D3 BONE HEALTH PO) Take by mouth.    [provider]  PERMETHRIN EX ASO - [id number] 02/26/17   [provider]    Family History Family History  Problem Relation Age of Onset   Hypertension Mother    Colon cancer Maternal Aunt    Breast cancer Maternal Aunt    Prostate cancer Maternal Uncle    Cancer Maternal Uncle    Hyperlipidemia Maternal Aunt     Social History Social History   Tobacco Use   Smoking status: Never   Smokeless tobacco: Never  Vaping Use  Vaping status: Never Used  Substance Use Topics   Alcohol use: Yes    Comment: Occassionally.   Drug use: No     Allergies   Duloxetine, Melatonin, and Bupropion   Review of Systems Review of Systems  Genitourinary:  Positive for frequency and urgency.  All other systems reviewed and are negative.    Physical Exam Triage Vital Signs ED Triage Vitals  Encounter Vitals Group     BP      Girls Systolic BP Percentile      Girls Diastolic BP Percentile      Boys Systolic BP Percentile      Boys Diastolic BP Percentile      Pulse      Resp      Temp       Temp src      SpO2      Weight      Height      Head Circumference      Peak Flow      Pain Score      Pain Loc      Pain Education      Exclude from Growth Chart    No data found.  Updated Vital Signs BP 122/85   Pulse 71   Temp 98 F (36.7 C)   Resp 19   SpO2 98%    Physical Exam Vitals and nursing note reviewed.  Constitutional:      General: She is not in acute distress.    Appearance: Normal appearance. She is normal weight.  HENT:     Head: Normocephalic and atraumatic.     Mouth/Throat:     Mouth: Mucous membranes are moist.     Pharynx: Oropharynx is clear.  Eyes:     Extraocular Movements: Extraocular movements intact.     Conjunctiva/sclera: Conjunctivae normal.     Pupils: Pupils are equal, round, and reactive to light.  Cardiovascular:     Rate and Rhythm: Normal rate and regular rhythm.     Pulses: Normal pulses.     Heart sounds: Normal heart sounds.  Pulmonary:     Effort: Pulmonary effort is normal.     Breath sounds: Normal breath sounds. No wheezing, rhonchi or rales.  Musculoskeletal:        General: Normal range of motion.  Skin:    General: Skin is warm and dry.  Neurological:     General: No focal deficit present.     Mental Status: She is alert and oriented to person, place, and time.  Psychiatric:        Mood and Affect: Mood normal.        Behavior: Behavior normal.      UC Treatments / Results  Labs (all labs ordered are listed, but only abnormal results are displayed) Labs Reviewed  POCT URINALYSIS DIP (MANUAL ENTRY) - Abnormal; Notable for the following components:      Result Value   Color, UA brown (*)    Clarity, UA cloudy (*)    Blood, UA large (*)    Protein Ur, POC =100 (*)    Nitrite, UA Positive (*)    Leukocytes, UA Large (3+) (*)    All other components within normal limits  URINE CULTURE  CERVICOVAGINAL ANCILLARY ONLY    EKG   Radiology No results found.  Procedures Procedures (including  critical care time)  Medications Ordered in UC Medications - No data to display  Initial Impression / Assessment and Plan /  UC Course  I have reviewed the triage vital signs and the nursing notes.  Pertinent labs & imaging results that were available during my care of the patient were reviewed by me and considered in my medical decision making (see chart for details).     MDM: 1.  Acute cystitis with hematuria-Rx'd cefdinir  300 mg capsule: Take 1 capsule twice daily for 7 days.  UA revealed above, urine culture ordered; 2.  Vaginal discharge-Aptima swab ordered. Advised patient to take medication as directed with food to completion.  Encouraged to increase daily water intake to 64 ounces per day while taking this medication.  Advised we will follow-up with urine culture and Aptima swab results once received.  Advised if symptoms worsen and/or unresolved please follow-up with your PCP, [facility], or here for further evaluation.  Patient discharged home, hemodynamically stable Final Clinical Impressions(s) / UC Diagnoses   Final diagnoses:  Acute cystitis with hematuria  Vaginal discharge     Discharge Instructions      Advised patient to take medication as directed with food to completion.  Encouraged to increase daily water intake to 64 ounces per day while taking this medication.  Advised we will follow-up with urine culture and Aptima swab results once received.  Advised if symptoms worsen and/or unresolved please follow-up with your PCP, [facility], or here for further evaluation.     ED Prescriptions     Medication Sig Dispense Auth. Provider   cefdinir  (OMNICEF ) 300 MG capsule Take 1 capsule (300 mg total) by mouth 2 (two) times daily for 7 days. 14 capsule Almeter Westhoff, FNP      PDMP not reviewed this encounter.   Teddy Sharper, FNP 05/28/24 [phone]

## 2024-05-28 NOTE — ED Triage Notes (Addendum)
 Pt presents to uc with co dysuria since yesterday and new onset of hematuria and urinary frequency. No otc medications. Pt requesting bv swab as well

## 2024-05-29 ENCOUNTER — Ambulatory Visit (HOSPITAL_COMMUNITY): Payer: Self-pay

## 2024-05-29 LAB — CERVICOVAGINAL ANCILLARY ONLY
Bacterial Vaginitis (gardnerella): POSITIVE — AB
Candida Glabrata: NEGATIVE
Candida Vaginitis: NEGATIVE
Comment: NEGATIVE
Comment: NEGATIVE
Comment: NEGATIVE

## 2024-05-29 MED ORDER — METRONIDAZOLE 500 MG PO TABS: 500.0000 mg | ORAL_TABLET | Freq: Two times a day (BID) | ORAL | 0 refills | Status: AC

## 2024-05-30 LAB — URINE CULTURE: Culture: >=100000 — AB

## 2024-08-19 ENCOUNTER — Ambulatory Visit: Admitting: Family Medicine

## 2024-10-21 ENCOUNTER — Ambulatory Visit: Payer: Self-pay | Admitting: Nurse Practitioner
